# Patient Record
Sex: Female | Born: 1970 | Race: White | Hispanic: No | Marital: Married | Smoking: Never smoker
Health system: Southern US, Community
[De-identification: ages and names within clinical notes are randomized; demographics above are authoritative.]

## PROBLEM LIST (undated history)

## (undated) HISTORY — PX: BREAST BIOPSY: SHX20

---

## 2004-02-13 ENCOUNTER — Ambulatory Visit: Payer: Self-pay | Admitting: Family Medicine

## 2004-11-17 ENCOUNTER — Ambulatory Visit: Payer: Self-pay | Admitting: Unknown Physician Specialty

## 2005-08-11 ENCOUNTER — Other Ambulatory Visit: Payer: Self-pay

## 2005-08-11 ENCOUNTER — Emergency Department: Payer: Self-pay | Admitting: Emergency Medicine

## 2006-10-10 ENCOUNTER — Observation Stay: Payer: Self-pay | Admitting: Obstetrics and Gynecology

## 2006-10-11 ENCOUNTER — Inpatient Hospital Stay: Payer: Self-pay | Admitting: Obstetrics and Gynecology

## 2009-02-26 ENCOUNTER — Observation Stay: Payer: Self-pay | Admitting: Obstetrics and Gynecology

## 2009-04-14 ENCOUNTER — Ambulatory Visit: Payer: Self-pay | Admitting: Obstetrics and Gynecology

## 2009-04-15 ENCOUNTER — Inpatient Hospital Stay: Payer: Self-pay | Admitting: Obstetrics and Gynecology

## 2010-11-09 ENCOUNTER — Ambulatory Visit: Payer: Self-pay | Admitting: Obstetrics and Gynecology

## 2011-04-14 ENCOUNTER — Ambulatory Visit: Payer: Self-pay

## 2011-04-21 ENCOUNTER — Ambulatory Visit: Payer: Self-pay

## 2011-05-17 ENCOUNTER — Ambulatory Visit: Payer: Self-pay | Admitting: Unknown Physician Specialty

## 2011-07-06 ENCOUNTER — Ambulatory Visit: Payer: Self-pay | Admitting: Internal Medicine

## 2011-07-06 LAB — CBC CANCER CENTER
Basophil %: 1 %
Eosinophil #: 0.1 x10 3/mm (ref 0.0–0.7)
HCT: 35.8 % (ref 35.0–47.0)
Lymphocyte #: 1.4 x10 3/mm (ref 1.0–3.6)
Lymphocyte %: 20.9 %
MCH: 28.2 pg (ref 26.0–34.0)
MCHC: 33.1 g/dL (ref 32.0–36.0)
Monocyte %: 10.1 %
Neutrophil #: 4.3 x10 3/mm (ref 1.4–6.5)
Neutrophil %: 65.8 %
RDW: 14.1 % (ref 11.5–14.5)
WBC: 6.6 x10 3/mm (ref 3.6–11.0)

## 2011-07-06 LAB — IRON AND TIBC
Iron Bind.Cap.(Total): 428 ug/dL (ref 250–450)
Iron Saturation: 17 %
Unbound Iron-Bind.Cap.: 357 ug/dL

## 2011-07-06 LAB — CREATININE, SERUM
Creatinine: 0.84 mg/dL (ref 0.60–1.30)
EGFR (Non-African Amer.): >60

## 2011-07-06 LAB — FERRITIN: Ferritin (ARMC): 23 ng/mL (ref 8–388)

## 2011-07-07 LAB — PROT IMMUNOELECTROPHORES(ARMC)

## 2011-07-07 LAB — KAPPA/LAMBDA FREE LIGHT CHAINS (ARMC)

## 2011-07-07 LAB — URINE IEP, RANDOM

## 2011-07-28 ENCOUNTER — Ambulatory Visit: Payer: Self-pay | Admitting: Internal Medicine

## 2011-08-25 ENCOUNTER — Ambulatory Visit: Payer: Self-pay | Admitting: Unknown Physician Specialty

## 2011-09-21 ENCOUNTER — Ambulatory Visit: Payer: Self-pay | Admitting: Internal Medicine

## 2011-09-21 LAB — CANCER CENTER HEMOGLOBIN: HGB: 11.7 g/dL — ABNORMAL LOW (ref 12.0–16.0)

## 2011-09-22 LAB — PROT IMMUNOELECTROPHORES(ARMC)

## 2011-09-27 ENCOUNTER — Ambulatory Visit: Payer: Self-pay | Admitting: Internal Medicine

## 2011-11-30 ENCOUNTER — Ambulatory Visit: Payer: Self-pay | Admitting: Obstetrics and Gynecology

## 2011-12-01 ENCOUNTER — Ambulatory Visit: Payer: Self-pay | Admitting: Obstetrics and Gynecology

## 2013-03-29 HISTORY — PX: BREAST BIOPSY: SHX20

## 2013-07-06 ENCOUNTER — Ambulatory Visit: Payer: Self-pay

## 2013-07-23 ENCOUNTER — Ambulatory Visit: Payer: Self-pay

## 2014-10-16 ENCOUNTER — Other Ambulatory Visit: Payer: Self-pay | Admitting: Obstetrics and Gynecology

## 2014-10-16 DIAGNOSIS — Z1231 Encounter for screening mammogram for malignant neoplasm of breast: Secondary | ICD-10-CM

## 2014-11-01 ENCOUNTER — Ambulatory Visit
Admission: RE | Admit: 2014-11-01 | Discharge: 2014-11-01 | Disposition: A | Discharge status: Home / Self Care | Payer: BLUE CROSS/BLUE SHIELD | Source: Ambulatory Visit | Attending: Obstetrics and Gynecology | Admitting: Obstetrics and Gynecology

## 2014-11-01 ENCOUNTER — Other Ambulatory Visit: Payer: Self-pay | Admitting: Obstetrics and Gynecology

## 2014-11-01 DIAGNOSIS — Z1231 Encounter for screening mammogram for malignant neoplasm of breast: Secondary | ICD-10-CM

## 2015-01-10 IMAGING — MG MM ADDITIONAL VIEWS AT NO CHARGE
2 series · 8 of 8 positions shown · non-contrast
Comparison: Priors

CLINICAL DATA: Patient recalled from screening for possible right
breast asymmetry.

EXAM:
DIGITAL DIAGNOSTIC  RIGHT MAMMOGRAM WITH CAD
ULTRASOUND RIGHT BREAST

[R CC · right · 5 of 5 slices shown]
[im 1/5]
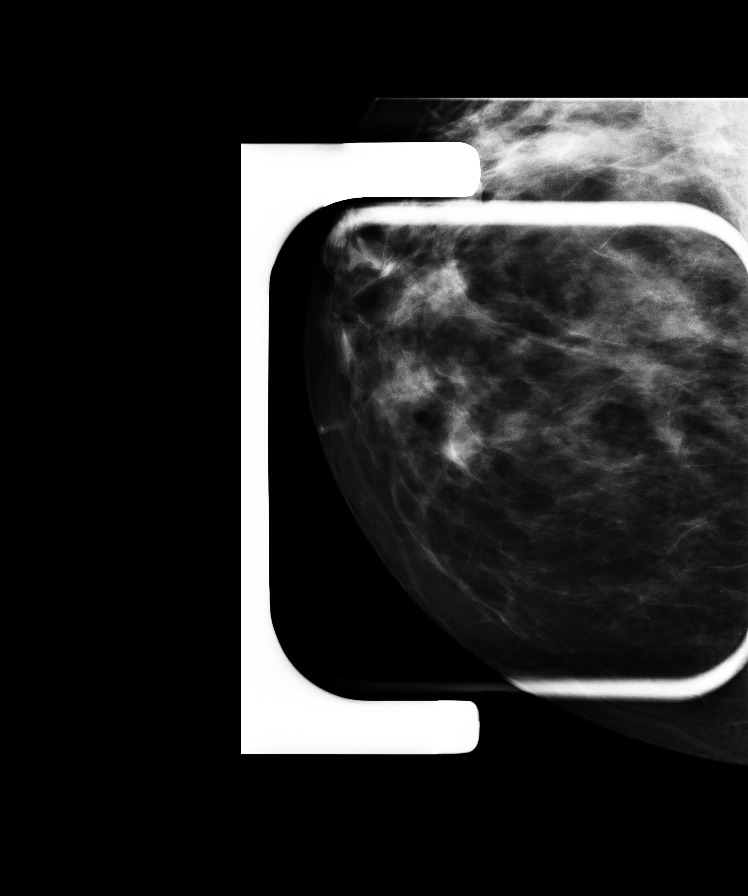
[im 2/5]
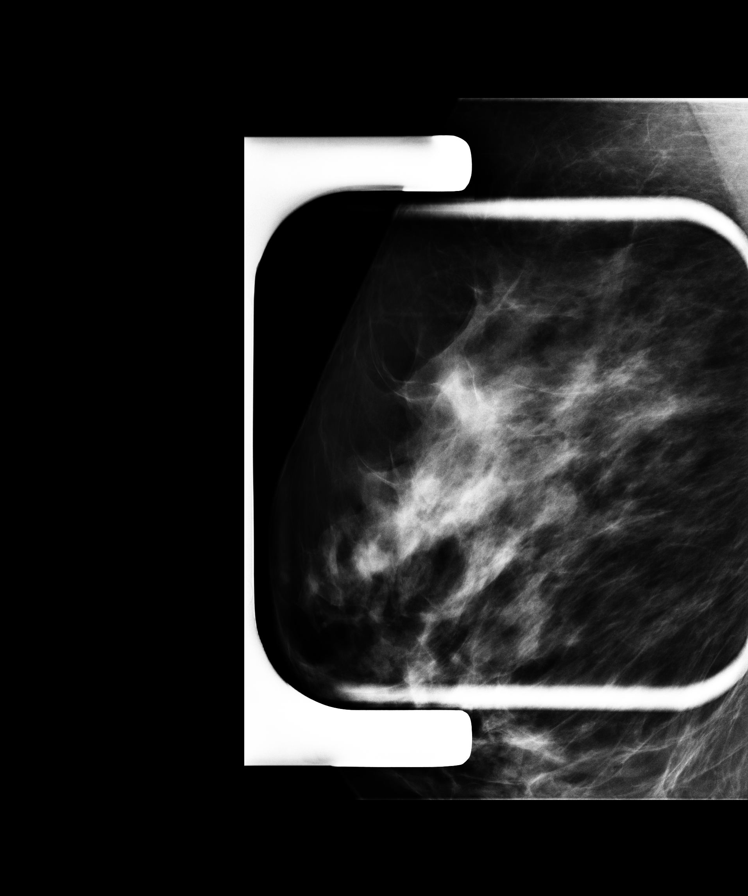
[im 3/5]
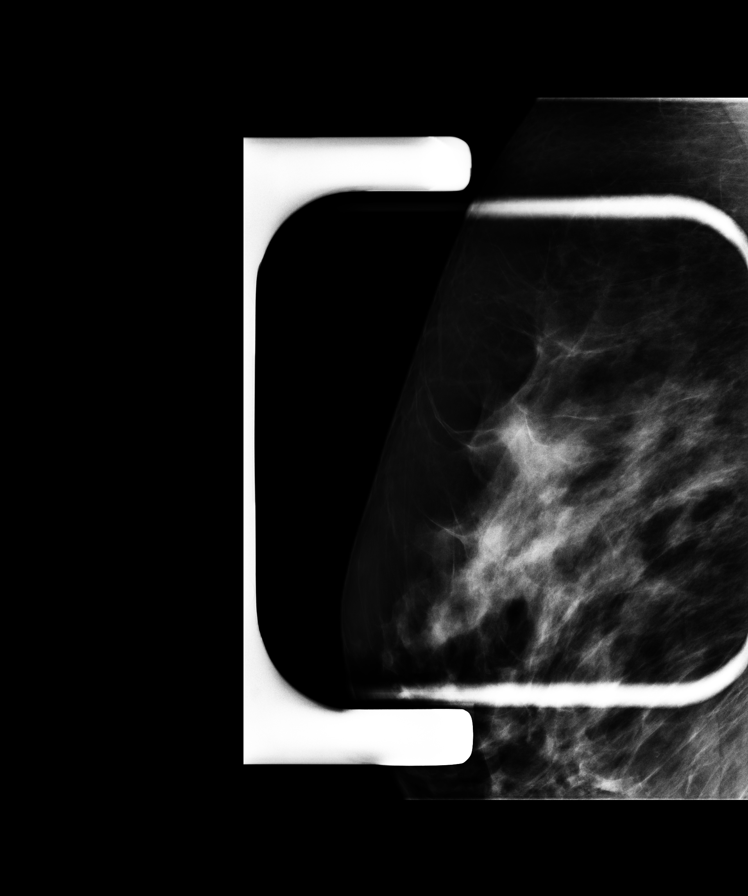
[im 4/5]
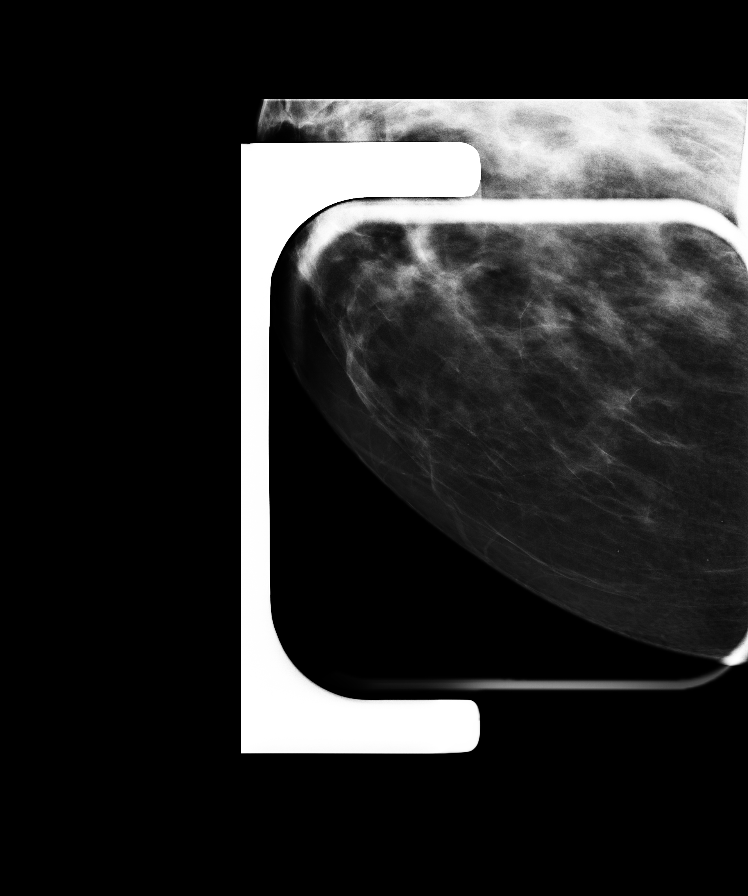
[im 5/5]
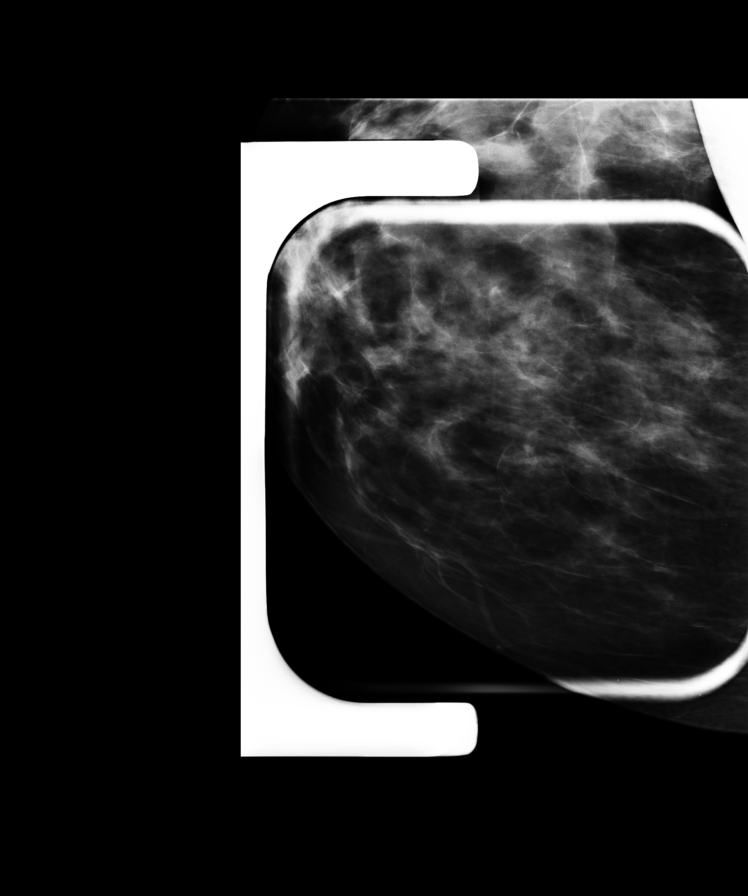

[R ML · right · 3 of 3 slices shown]
[im 1/3]
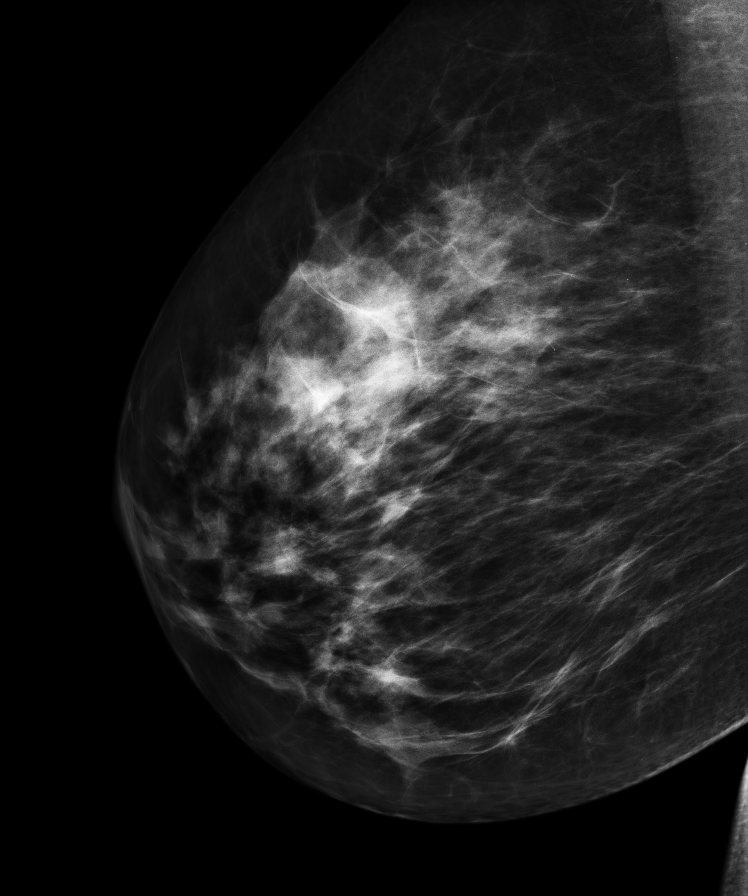
[im 2/3]
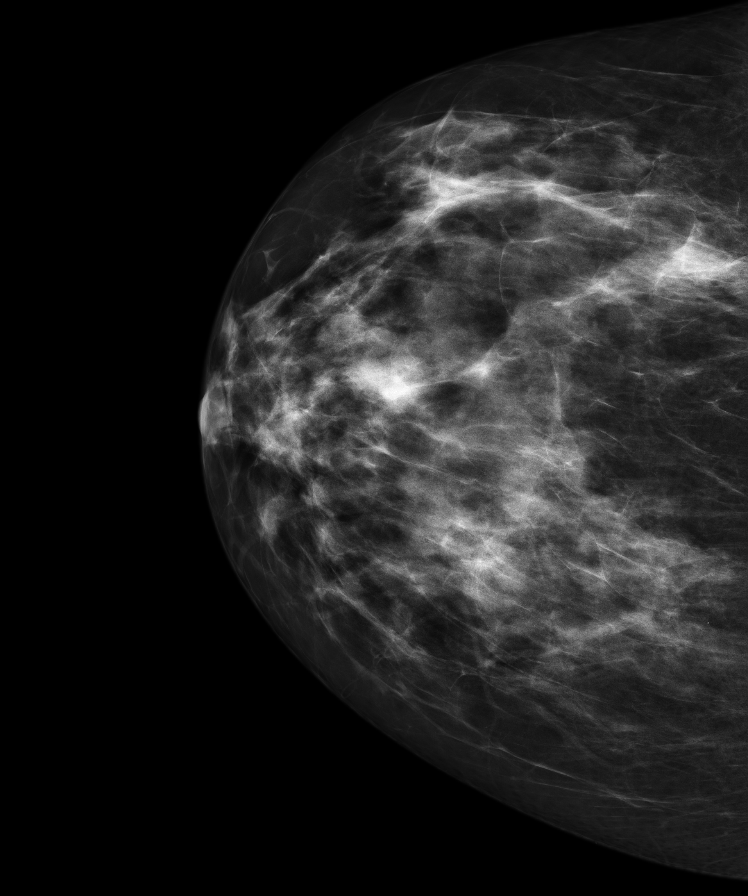
[im 3/3]
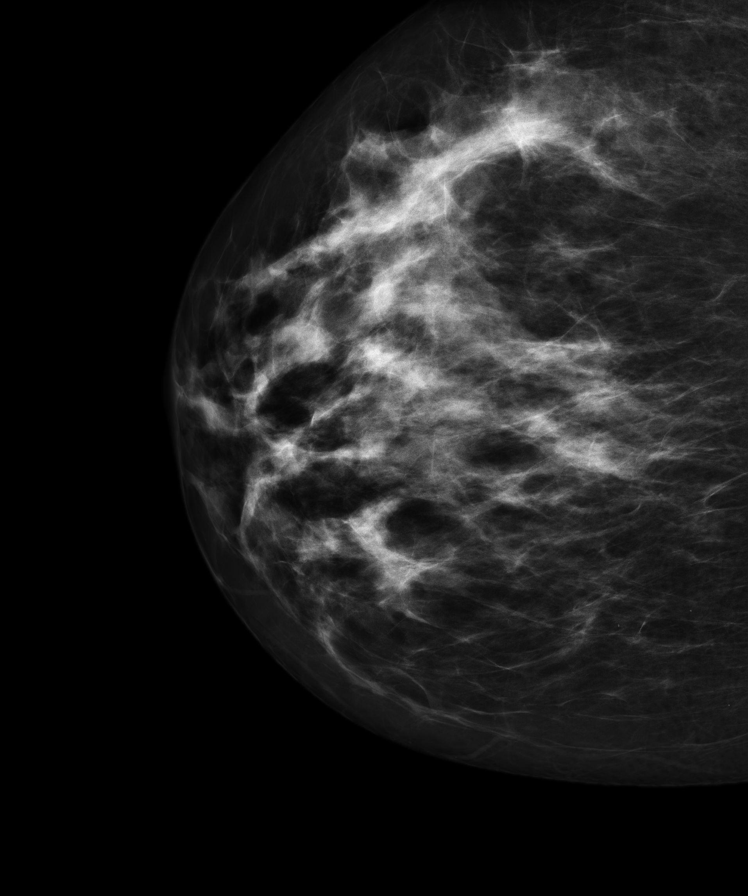

[8 of 8 positions shown; findings below may reference images not displayed]

ACR Breast Density Category c: The breast tissue is heterogeneously
dense, which may obscure small masses.
FINDINGS: Spot compression CC and MLO views as well as rolled CC and ML views
of the right breast were obtained. The questioned asymmetry within
the upper inner right breast appeared to resolve with additional
imaging, most compatible with overlapping fibroglandular breast
tissue.

Mammographic images were processed with CAD.

On physical exam, I palpate no discrete mass within the upper inner
right breast.

Ultrasound is performed, showing dense fibroglandular tissue without
concerning underlying mass.
IMPRESSION: No mammographic evidence for malignancy.

RECOMMENDATION:
Screening mammogram in one year.(Code:DW-F-B5O)

I have discussed the findings and recommendations with the patient.
Results were also provided in writing at the conclusion of the
visit. If applicable, a reminder letter will be sent to the patient
regarding the next appointment.

BI-RADS CATEGORY  1: Negative.

## 2015-07-22 ENCOUNTER — Ambulatory Visit: Payer: BLUE CROSS/BLUE SHIELD | Admitting: Internal Medicine

## 2016-04-20 IMAGING — MG MM DIGITAL SCREENING BILAT W/ TOMO W/ CAD
8 of 12 series · 8 of 28 positions shown · non-contrast
Comparison: Previous exam(s).

CLINICAL DATA: Screening.

EXAM:
DIGITAL SCREENING BILATERAL MAMMOGRAM WITH 3D TOMO WITH CAD

[R MLO synth-2D]
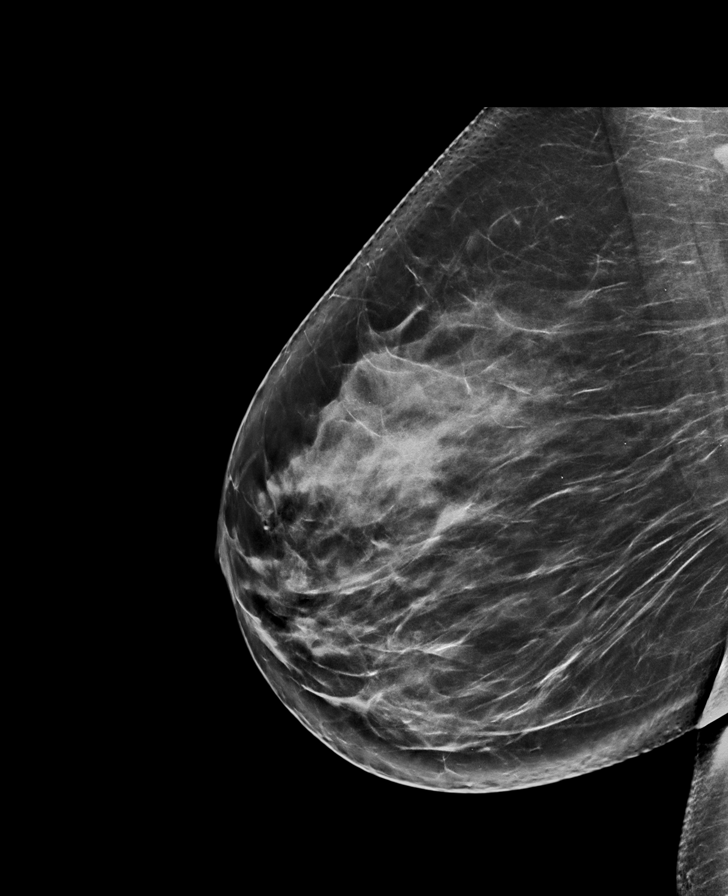

[L CC synth-2D]
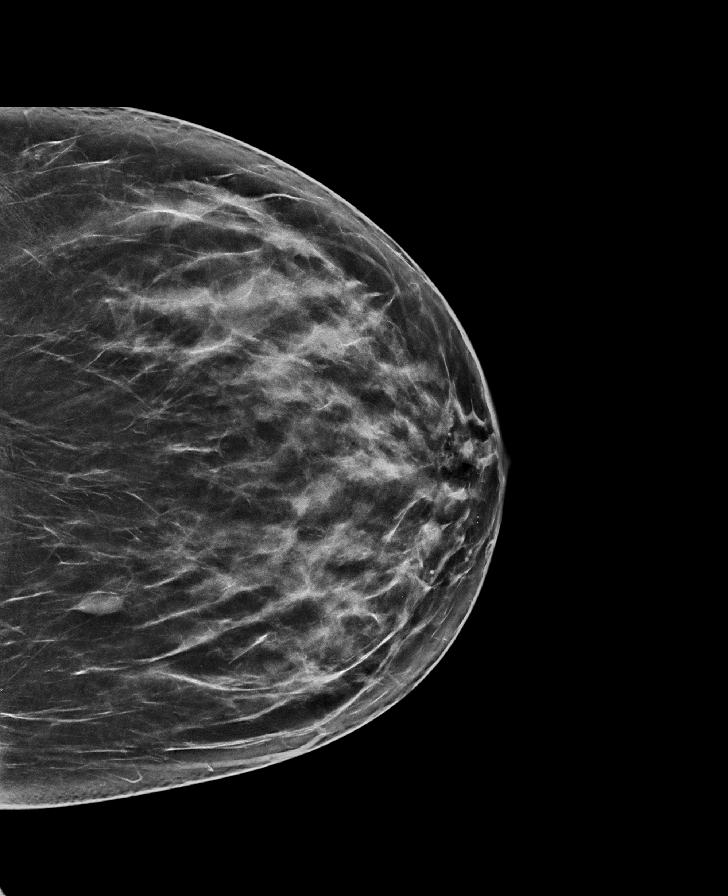

[L CC]
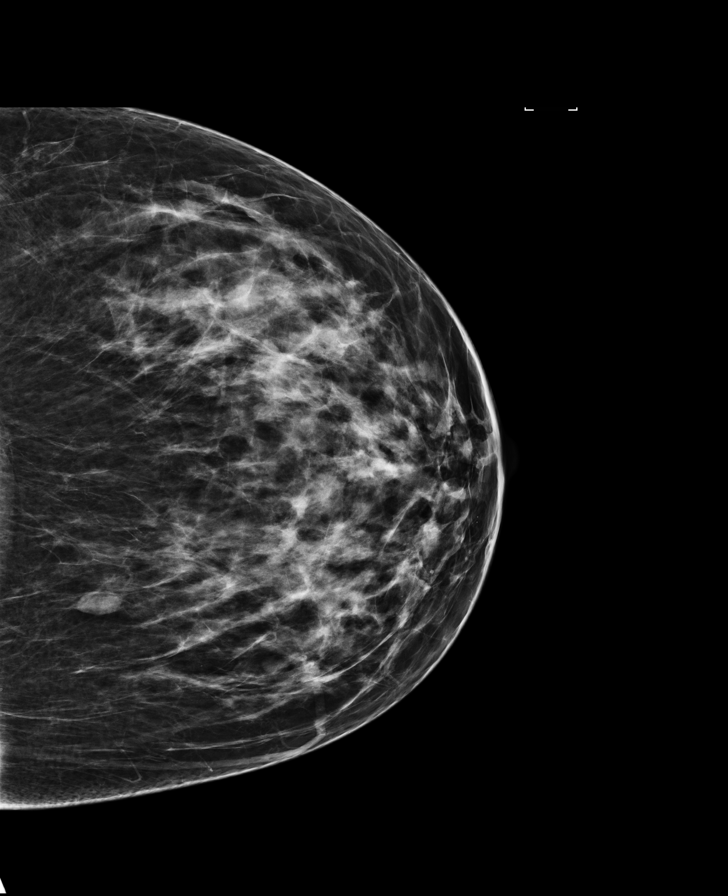

[R MLO]
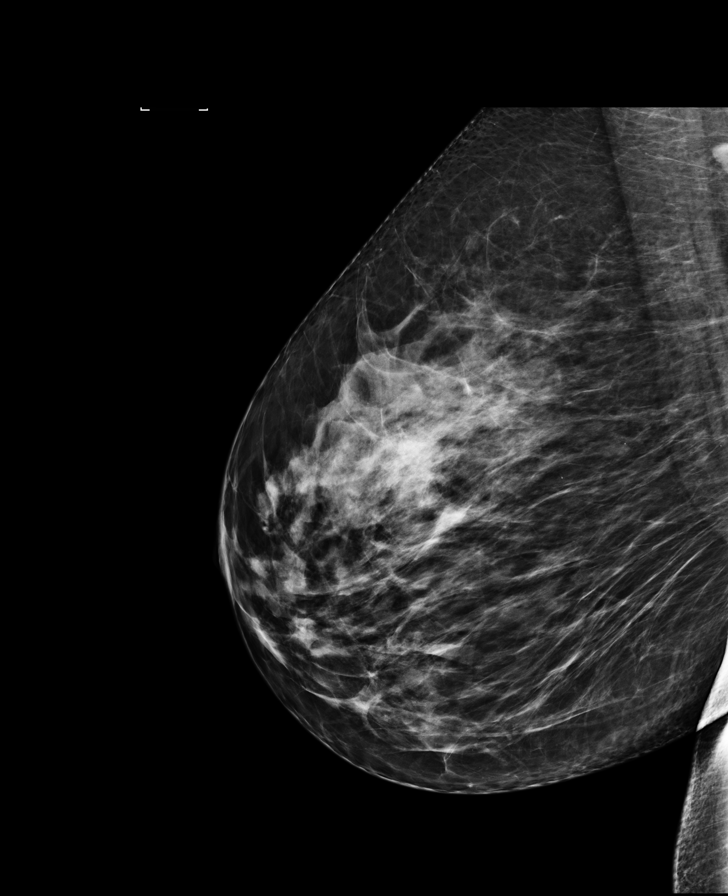

[R CC]
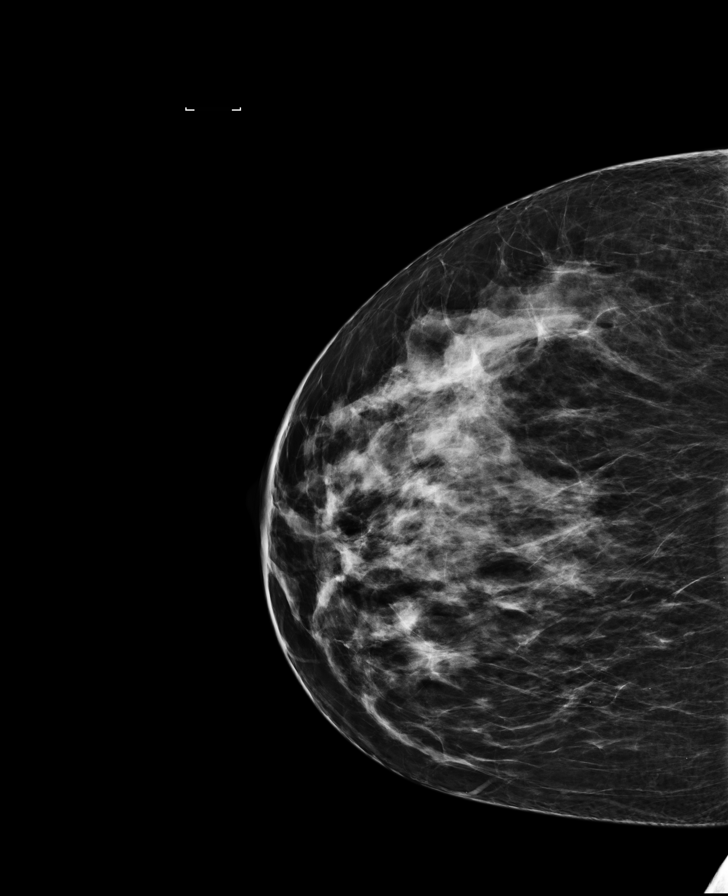

[L MLO synth-2D]
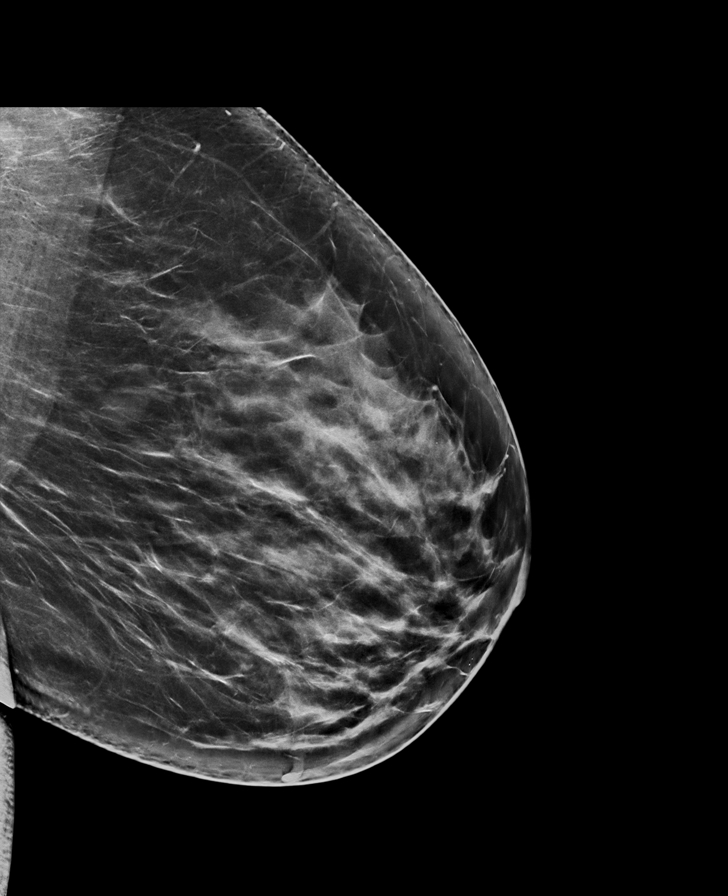

[R CC synth-2D]
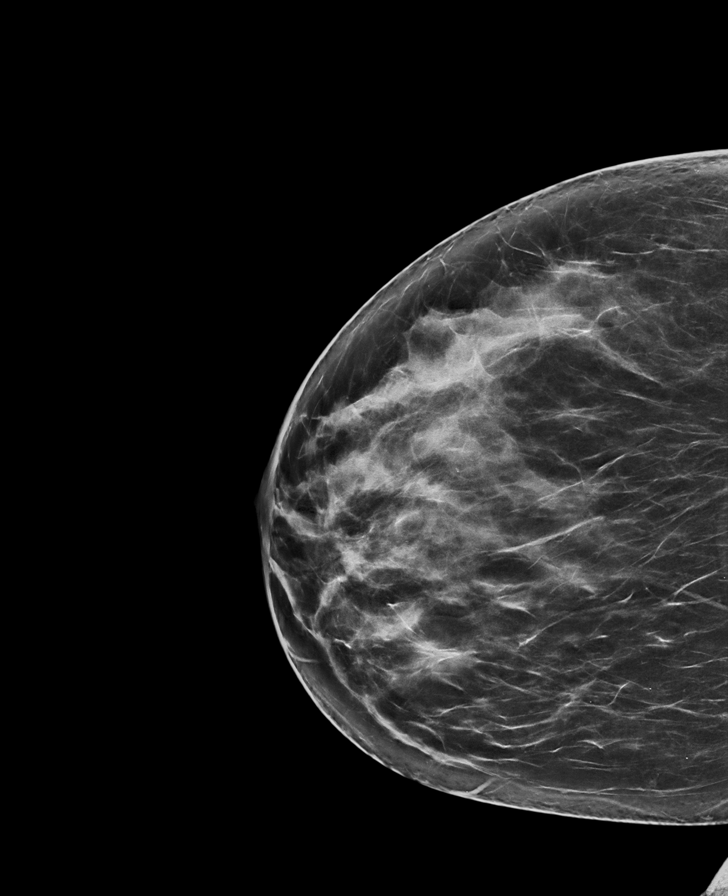

[L MLO]
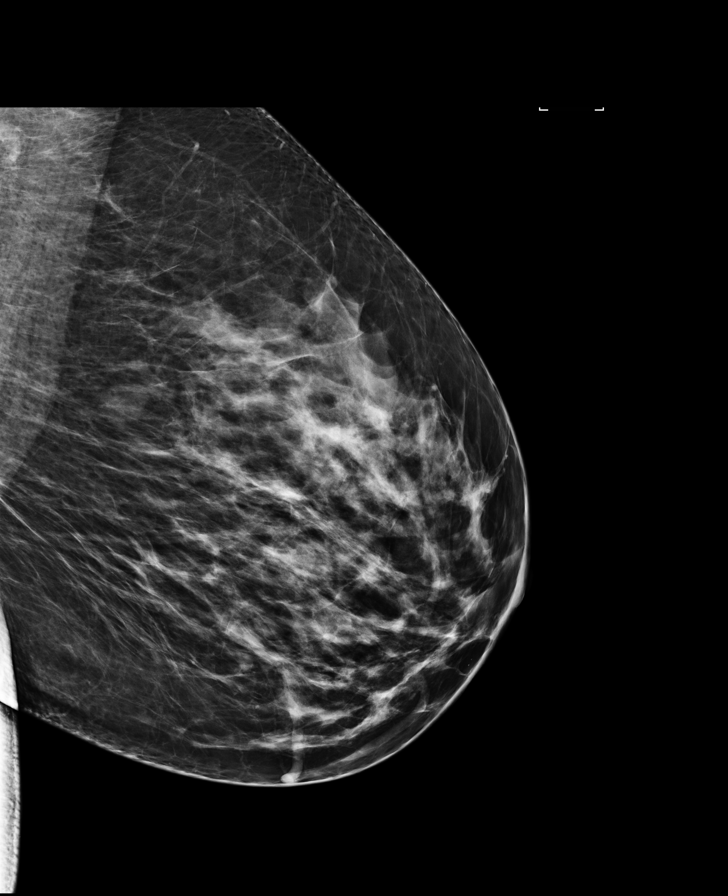

[8 of 28 positions shown; findings below may reference images not displayed]

ACR Breast Density Category c: The breast tissue is heterogeneously
dense, which may obscure small masses.
FINDINGS: There are no findings suspicious for malignancy. Images were
processed with CAD.
IMPRESSION: No mammographic evidence of malignancy. A result letter of this
screening mammogram will be mailed directly to the patient.

RECOMMENDATION:
Screening mammogram in one year. (Code:OA-G-1SS)

BI-RADS CATEGORY  1: Negative.

## 2019-04-26 ENCOUNTER — Other Ambulatory Visit: Payer: Self-pay | Admitting: Obstetrics and Gynecology

## 2019-04-26 DIAGNOSIS — Z1231 Encounter for screening mammogram for malignant neoplasm of breast: Secondary | ICD-10-CM

## 2019-06-05 ENCOUNTER — Ambulatory Visit
Admission: RE | Admit: 2019-06-05 | Discharge: 2019-06-05 | Disposition: A | Discharge status: Home / Self Care | Payer: PRIVATE HEALTH INSURANCE | Source: Ambulatory Visit | Attending: Obstetrics and Gynecology | Admitting: Obstetrics and Gynecology

## 2019-06-05 DIAGNOSIS — Z1231 Encounter for screening mammogram for malignant neoplasm of breast: Secondary | ICD-10-CM | POA: Diagnosis not present

## 2020-05-15 ENCOUNTER — Other Ambulatory Visit: Payer: Self-pay | Admitting: Orthopedic Surgery

## 2020-05-15 ENCOUNTER — Other Ambulatory Visit: Payer: Self-pay | Admitting: Obstetrics and Gynecology

## 2020-05-15 DIAGNOSIS — Z1231 Encounter for screening mammogram for malignant neoplasm of breast: Secondary | ICD-10-CM

## 2020-06-05 ENCOUNTER — Ambulatory Visit
Admission: RE | Admit: 2020-06-05 | Discharge: 2020-06-05 | Disposition: A | Discharge status: Home / Self Care | Payer: PRIVATE HEALTH INSURANCE | Source: Ambulatory Visit | Attending: Obstetrics and Gynecology | Admitting: Obstetrics and Gynecology

## 2020-06-05 ENCOUNTER — Other Ambulatory Visit: Payer: Self-pay

## 2020-06-05 DIAGNOSIS — Z1231 Encounter for screening mammogram for malignant neoplasm of breast: Secondary | ICD-10-CM | POA: Insufficient documentation

## 2021-01-15 ENCOUNTER — Other Ambulatory Visit: Payer: Self-pay

## 2021-01-15 ENCOUNTER — Ambulatory Visit (INDEPENDENT_AMBULATORY_CARE_PROVIDER_SITE_OTHER): Payer: No Typology Code available for payment source | Admitting: Dermatology

## 2021-01-15 ENCOUNTER — Encounter: Payer: Self-pay | Admitting: Dermatology

## 2021-01-15 DIAGNOSIS — L814 Other melanin hyperpigmentation: Secondary | ICD-10-CM

## 2021-01-15 DIAGNOSIS — L820 Inflamed seborrheic keratosis: Secondary | ICD-10-CM

## 2021-01-15 DIAGNOSIS — L811 Chloasma: Secondary | ICD-10-CM

## 2021-01-15 DIAGNOSIS — L918 Other hypertrophic disorders of the skin: Secondary | ICD-10-CM

## 2021-01-15 DIAGNOSIS — L578 Other skin changes due to chronic exposure to nonionizing radiation: Secondary | ICD-10-CM

## 2021-01-15 DIAGNOSIS — D224 Melanocytic nevi of scalp and neck: Secondary | ICD-10-CM

## 2021-01-15 DIAGNOSIS — D492 Neoplasm of unspecified behavior of bone, soft tissue, and skin: Secondary | ICD-10-CM

## 2021-01-15 DIAGNOSIS — Z1283 Encounter for screening for malignant neoplasm of skin: Secondary | ICD-10-CM

## 2021-01-15 DIAGNOSIS — D239 Other benign neoplasm of skin, unspecified: Secondary | ICD-10-CM

## 2021-01-15 DIAGNOSIS — L738 Other specified follicular disorders: Secondary | ICD-10-CM | POA: Diagnosis not present

## 2021-01-15 DIAGNOSIS — L821 Other seborrheic keratosis: Secondary | ICD-10-CM

## 2021-01-15 DIAGNOSIS — D1801 Hemangioma of skin and subcutaneous tissue: Secondary | ICD-10-CM

## 2021-01-15 DIAGNOSIS — D2361 Other benign neoplasm of skin of right upper limb, including shoulder: Secondary | ICD-10-CM

## 2021-01-15 DIAGNOSIS — L304 Erythema intertrigo: Secondary | ICD-10-CM

## 2021-01-15 NOTE — Progress Notes (Signed)
New Patient Visit  Subjective  Carol Richards is a 50 y.o. female who presents for the following: Total body skin exam (No hx of skin ca, or family hx of skin ca), Skin Tag (Bil axilla, around eyes, >2 yrs), and Nevus (Neck, ). The patient presents for Total-Body Skin Exam (TBSE) for skin cancer screening and mole check.  The following portions of the chart were reviewed this encounter and updated as appropriate:   Allergies  Meds  Problems  Med Hx  Surg Hx  Fam Hx     Review of Systems:  No other skin or systemic complaints except as noted in HPI or Assessment and Plan.  Objective  Well appearing patient in no apparent distress; mood and affect are within normal limits.  A full examination was performed including scalp, head, eyes, ears, nose, lips, neck, chest, axillae, abdomen, back, buttocks, bilateral upper extremities, bilateral lower extremities, hands, feet, fingers, toes, fingernails, and toenails. All findings within normal limits unless otherwise noted below.  forehead Yellow lobulated paps  forehead, chin Reticulated hyperpigmented patches.   R axilla, periocular Fleshy, skin-colored pedunculated papules.    Right posterior shoulder Firm pink/brown papulenodule with dimple sign.   R medial bicep Fleshy pap 0.8cm  L inframammary and R inframammary x 23, Total = 23 (23) Erythematous keratotic or waxy stuck-on papule or plaque.    Assessment & Plan   Lentigines - Scattered tan macules - Due to sun exposure - Benign-appearing, observe - Recommend daily broad spectrum sunscreen SPF 30+ to sun-exposed areas, reapply every 2 hours as needed. - Call for any changes  Seborrheic Keratoses - Stuck-on, waxy, tan-brown papules and/or plaques  - Benign-appearing - Discussed benign etiology and prognosis. - Observe - Call for any changes  Melanocytic Nevi - Tan-brown and/or pink-flesh-colored symmetric macules and papules - Benign appearing on exam  today - Observation - Call clinic for new or changing moles - Recommend daily use of broad spectrum spf 30+ sunscreen to sun-exposed areas.   Hemangiomas - Red papules - Discussed benign nature - Observe - Call for any changes  Actinic Damage - Chronic condition, secondary to cumulative UV/sun exposure - diffuse scaly erythematous macules with underlying dyspigmentation - Recommend daily broad spectrum sunscreen SPF 30+ to sun-exposed areas, reapply every 2 hours as needed.  - Staying in the shade or wearing long sleeves, sun glasses (UVA+UVB protection) and wide brim hats (4-inch brim around the entire circumference of the hat) are also recommended for sun protection.  - Call for new or changing lesions.  Skin cancer screening performed today.  Sebaceous hyperplasia forehead Benign-appearing.  Observation.  Call clinic for new or changing lesions.  Recommend daily use of broad spectrum spf 30+ sunscreen to sun-exposed areas.  Discussed cosmetic ED $60 for first lesion and $15 for each additional txted on same day.  Melasma forehead, chin Melasma is a chronic condition of persistent pigmented patches generally on the face, worse in summer due to higher UV exposure.  Oral estrogen containing BCPs or supplements can exacerbate condition.  Recommend daily broad spectrum tinted sunscreen SPF 30+ to face, preferably with Zinc or Titanium Dioxide. Discussed Rx topical bleaching creams (i.e. hydroquinone), OTC HelioCare supplement, chemical peels (would need multiple for best result).   Will discuss treatment further on f/u  Skin tag R axilla, periocular Benign, Discussed Snip excision Insurance usually does not cover this.  Discussed out-of-pocket fee of $115 if she is interested.  Dermatofibroma Right posterior shoulder Benign-appearing.  Observation.  Call clinic for new or changing moles.  Recommend daily use of broad spectrum spf 30+ sunscreen to sun-exposed areas.     Intertrigo bil Inframammary Intertrigo is a chronic recurrent rash that occurs in skin fold areas that may be associated with friction; heat; moisture; yeast; fungus; and bacteria.  It is exacerbated by increased movement / activity; sweating; and higher atmospheric temperature.  Start [facility] Iodoquinol 1%, Hydrocortisone 2.5%, Niacinamide 2% Cream twice a day to affected areas for up to two weeks.  The patient was advised this is not covered by insurance since it is made by a compounding pharmacy. They will receive an email to check out and the medication will be mailed to their home.    Neoplasm of skin R medial bicep Epidermal / dermal shaving  Lesion diameter (cm):  0.8 Informed consent: discussed and consent obtained   Timeout: patient name, date of birth, surgical site, and procedure verified   Procedure prep:  Patient was prepped and draped in usual sterile fashion Prep type:  Isopropyl alcohol Anesthesia: the lesion was anesthetized in a standard fashion   Anesthetic:  1% lidocaine w/ epinephrine 1-100,000 buffered w/ 8.4% NaHCO3 Instrument used: flexible razor blade   Hemostasis achieved with: pressure, aluminum chloride and electrodesiccation   Outcome: patient tolerated procedure well   Post-procedure details: sterile dressing applied and wound care instructions given   Dressing type: bandage and bacitracin    Specimen 1 - Surgical pathology Differential Diagnosis: D48.5 Irritated nevus vs Irritated Skin tag vs other Check Margins: No Fleshy pap 0.8cm  Inflamed seborrheic keratosis L inframammary and R inframammary x 23, Total = 23 Destruction of lesion - L inframammary and R inframammary x 23, Total = 23 Complexity: simple   Destruction method: cryotherapy   Informed consent: discussed and consent obtained   Timeout:  patient name, date of birth, surgical site, and procedure verified Lesion destroyed using liquid nitrogen: Yes   Region frozen until ice  ball extended beyond lesion: Yes   Outcome: patient tolerated procedure well with no complications   Post-procedure details: wound care instructions given    Skin cancer screening  Return in about 3 months (around 04/17/2021) for f/u ISK, Intertrigo, discuss melasma txt and possibly txt nevus face and sebaceous hyperplasia face.  I, Othelia Pulling, RMA, am acting as scribe for Sarina Ser, MD . Documentation: I have reviewed the above documentation for accuracy and completeness, and I agree with the above.  Sarina Ser, MD

## 2021-01-15 NOTE — Patient Instructions (Addendum)
If you have any questions or concerns for your doctor, please call our main line at [phone] and press option 4 to reach your doctor's medical assistant. If no one answers, please leave a voicemail as directed and we will return your call as soon as possible. Messages left after 4 pm will be answered the following business day.   You may also send us a message via MyChart. We typically respond to MyChart messages within 1-2 business days.  For prescription refills, please ask your pharmacy to contact our office. Our fax number is [phone].  If you have an urgent issue when the clinic is closed that cannot wait until the next business day, you can page your doctor at the number below.    Please note that while we do our best to be available for urgent issues outside of office hours, we are not available 24/7.   If you have an urgent issue and are unable to reach us, you may choose to seek medical care at your doctor's office, retail clinic, urgent care center, or emergency room.  If you have a medical emergency, please immediately call 911 or go to the emergency department.  Pager Numbers  - Dr. Kowalski: [phone]  - Dr. Moye: [phone]  - Dr. Stewart: [phone]  In the event of inclement weather, please call our main line at [phone] for an update on the status of any delays or closures.  Dermatology Medication Tips: Please keep the boxes that topical medications come in in order to help keep track of the instructions about where and how to use these. Pharmacies typically print the medication instructions only on the boxes and not directly on the medication tubes.   If your medication is too expensive, please contact our office at [phone] option 4 or send us a message through MyChart.   We are unable to tell what your co-pay for medications will be in advance as this is different depending on your insurance coverage. However, we may be able to find a substitute  medication at lower cost or fill out paperwork to get insurance to cover a needed medication.   If a prior authorization is required to get your medication covered by your insurance company, please allow us 1-2 business days to complete this process.  Drug prices often vary depending on where the prescription is filled and some pharmacies may offer cheaper prices.  The website www.goodrx.com contains coupons for medications through different pharmacies. The prices here do not account for what the cost may be with help from insurance (it may be cheaper with your insurance), but the website can give you the price if you did not use any insurance.  - You can print the associated coupon and take it with your prescription to the pharmacy.  - You may also stop by our office during regular business hours and pick up a GoodRx coupon card.  - If you need your prescription sent electronically to a different pharmacy, notify our office through [facility] MyChart or by phone at [phone] option 4.   Wound Care Instructions  Cleanse wound gently with soap and water once a day then pat dry with clean gauze. Apply a thing coat of Petrolatum (petroleum jelly, "Vaseline") over the wound (unless you have an allergy to this). We recommend that you use a new, sterile tube of Vaseline. Do not pick or remove scabs. Do not remove the yellow or white "healing tissue" from the base of the wound.  Cover the   wound with fresh, clean, nonstick gauze and secure with paper tape. You may use Band-Aids in place of gauze and tape if the would is small enough, but would recommend trimming much of the tape off as there is often too much. Sometimes Band-Aids can irritate the skin.  You should call the office for your biopsy report after 1 week if you have not already been contacted.  If you experience any problems, such as abnormal amounts of bleeding, swelling, significant bruising, significant pain, or evidence of infection,  please call the office immediately.  FOR ADULT SURGERY PATIENTS: If you need something for pain relief you may take 1 extra strength Tylenol (acetaminophen) AND 2 Ibuprofen (200mg  each) together every 4 hours as needed for pain. (do not take these if you are allergic to them or if you have a reason you should not take them.) Typically, you may only need pain medication for 1 to 3 days.   Instructions for [facility] Medications  One or more of your medications was sent to the [facility] mail order compounding pharmacy. You will receive an email from them and can purchase the medicine through that link. It will then be mailed to your home at the address you confirmed. If for any reason you do not receive an email from them, please check your spam folder. If you still do not find the email, please let us know. [facility] phone number is [phone].

## 2021-01-19 ENCOUNTER — Telehealth: Payer: Self-pay

## 2021-01-19 NOTE — Telephone Encounter (Signed)
-----   Message from Ralene Bathe, MD sent at 01/16/2021  6:25 PM EDT ----- Diagnosis Skin , right medial bicep [place]  Benign skin tag No further treatment needed

## 2021-01-19 NOTE — Telephone Encounter (Signed)
Patient informed of pathology results 

## 2021-02-04 ENCOUNTER — Ambulatory Visit
Admission: RE | Admit: 2021-02-04 | Discharge: 2021-02-04 | Disposition: A | Discharge status: Home / Self Care | Payer: No Typology Code available for payment source | Source: Ambulatory Visit | Attending: Obstetrics and Gynecology | Admitting: Obstetrics and Gynecology

## 2021-02-04 ENCOUNTER — Other Ambulatory Visit: Payer: Self-pay

## 2021-02-04 DIAGNOSIS — N939 Abnormal uterine and vaginal bleeding, unspecified: Secondary | ICD-10-CM | POA: Diagnosis not present

## 2021-02-04 MED ORDER — SODIUM CHLORIDE 0.9 % IV SOLN
300.0000 mg | Freq: Once | INTRAVENOUS | Status: AC
  Administered 2021-02-04: 300 mg via INTRAVENOUS
  Filled 2021-02-04: qty 300

## 2021-02-04 NOTE — Discharge Instructions (Signed)
See information given on Iron sucrose infusion. Keep all appointments as scheduled.

## 2021-02-11 ENCOUNTER — Ambulatory Visit
Admission: RE | Admit: 2021-02-11 | Discharge: 2021-02-11 | Disposition: A | Discharge status: Home / Self Care | Payer: No Typology Code available for payment source | Source: Ambulatory Visit | Attending: Obstetrics and Gynecology | Admitting: Obstetrics and Gynecology

## 2021-02-11 ENCOUNTER — Other Ambulatory Visit: Payer: Self-pay

## 2021-02-11 DIAGNOSIS — D500 Iron deficiency anemia secondary to blood loss (chronic): Secondary | ICD-10-CM | POA: Diagnosis not present

## 2021-02-11 MED ORDER — SODIUM CHLORIDE 0.9 % IV SOLN
300.0000 mg | Freq: Once | INTRAVENOUS | Status: AC
  Administered 2021-02-11: 300 mg via INTRAVENOUS
  Filled 2021-02-11: qty 300

## 2021-02-18 ENCOUNTER — Ambulatory Visit
Admission: RE | Admit: 2021-02-18 | Discharge: 2021-02-18 | Disposition: A | Discharge status: Home / Self Care | Payer: No Typology Code available for payment source | Source: Ambulatory Visit | Attending: Obstetrics and Gynecology | Admitting: Obstetrics and Gynecology

## 2021-02-18 DIAGNOSIS — D500 Iron deficiency anemia secondary to blood loss (chronic): Secondary | ICD-10-CM | POA: Insufficient documentation

## 2021-02-18 MED ORDER — SODIUM CHLORIDE 0.9 % IV SOLN
300.0000 mg | Freq: Once | INTRAVENOUS | Status: AC
  Administered 2021-02-18: 300 mg via INTRAVENOUS
  Filled 2021-02-18: qty 300

## 2021-02-25 ENCOUNTER — Other Ambulatory Visit: Payer: Self-pay

## 2021-02-25 ENCOUNTER — Ambulatory Visit
Admission: RE | Admit: 2021-02-25 | Discharge: 2021-02-25 | Disposition: A | Discharge status: Home / Self Care | Payer: No Typology Code available for payment source | Source: Ambulatory Visit | Attending: Obstetrics and Gynecology | Admitting: Obstetrics and Gynecology

## 2021-02-25 DIAGNOSIS — N939 Abnormal uterine and vaginal bleeding, unspecified: Secondary | ICD-10-CM | POA: Insufficient documentation

## 2021-02-25 DIAGNOSIS — D500 Iron deficiency anemia secondary to blood loss (chronic): Secondary | ICD-10-CM | POA: Insufficient documentation

## 2021-02-25 MED ORDER — SODIUM CHLORIDE 0.9 % IV SOLN
300.0000 mg | INTRAVENOUS | Status: DC
  Filled 2021-02-25: qty 15

## 2021-02-25 MED ORDER — SODIUM CHLORIDE 0.9 % IV SOLN
300.0000 mg | Freq: Once | INTRAVENOUS | Status: DC
  Administered 2021-02-25: 300 mg via INTRAVENOUS
  Filled 2021-02-25: qty 300

## 2021-02-25 MED ORDER — PENTAFLUOROPROP-TETRAFLUOROETH EX AERO
INHALATION_SPRAY | CUTANEOUS | Status: AC
  Filled 2021-02-25: qty 30

## 2021-03-04 ENCOUNTER — Ambulatory Visit
Admission: RE | Admit: 2021-03-04 | Discharge: 2021-03-04 | Disposition: A | Discharge status: Home / Self Care | Payer: No Typology Code available for payment source | Source: Ambulatory Visit | Attending: Obstetrics and Gynecology | Admitting: Obstetrics and Gynecology

## 2021-03-04 DIAGNOSIS — D509 Iron deficiency anemia, unspecified: Secondary | ICD-10-CM | POA: Diagnosis present

## 2021-03-04 MED ORDER — SODIUM CHLORIDE 0.9 % IV SOLN
300.0000 mg | Freq: Once | INTRAVENOUS | Status: AC
  Administered 2021-03-04: 300 mg via INTRAVENOUS
  Filled 2021-03-04: qty 300

## 2021-03-04 MED ORDER — SODIUM CHLORIDE FLUSH 0.9 % IV SOLN
INTRAVENOUS | Status: AC
  Filled 2021-03-04: qty 20

## 2021-03-04 NOTE — Progress Notes (Signed)
Patient IRON infusion completed around 11:00. No signs of acute distress. VS stable. Patient was made aware of next week schedule provided MD order's confirmation.

## 2021-03-10 ENCOUNTER — Other Ambulatory Visit: Payer: Self-pay

## 2021-03-10 ENCOUNTER — Ambulatory Visit
Admission: RE | Admit: 2021-03-10 | Discharge: 2021-03-10 | Disposition: A | Discharge status: Home / Self Care | Payer: No Typology Code available for payment source | Source: Ambulatory Visit | Attending: Obstetrics and Gynecology | Admitting: Obstetrics and Gynecology

## 2021-03-10 DIAGNOSIS — D509 Iron deficiency anemia, unspecified: Secondary | ICD-10-CM | POA: Insufficient documentation

## 2021-03-10 MED ORDER — SODIUM CHLORIDE FLUSH 0.9 % IV SOLN
INTRAVENOUS | Status: AC
  Filled 2021-03-10: qty 10

## 2021-03-10 MED ORDER — SODIUM CHLORIDE 0.9 % IV SOLN
300.0000 mg | Freq: Once | INTRAVENOUS | Status: AC
  Administered 2021-03-10: 300 mg via INTRAVENOUS
  Filled 2021-03-10: qty 300

## 2021-03-11 ENCOUNTER — Ambulatory Visit: Payer: No Typology Code available for payment source

## 2021-03-15 NOTE — Progress Notes (Signed)
Iron deficiency anemia

## 2021-03-17 NOTE — Progress Notes (Signed)
Iron deficient anemia

## 2021-04-20 ENCOUNTER — Other Ambulatory Visit: Payer: Self-pay

## 2021-04-20 ENCOUNTER — Ambulatory Visit (INDEPENDENT_AMBULATORY_CARE_PROVIDER_SITE_OTHER): Payer: PRIVATE HEALTH INSURANCE | Admitting: Dermatology

## 2021-04-20 ENCOUNTER — Encounter: Payer: Self-pay | Admitting: Dermatology

## 2021-04-20 DIAGNOSIS — L738 Other specified follicular disorders: Secondary | ICD-10-CM | POA: Diagnosis not present

## 2021-04-20 DIAGNOSIS — L304 Erythema intertrigo: Secondary | ICD-10-CM

## 2021-04-20 DIAGNOSIS — L811 Chloasma: Secondary | ICD-10-CM | POA: Diagnosis not present

## 2021-04-20 DIAGNOSIS — L700 Acne vulgaris: Secondary | ICD-10-CM | POA: Diagnosis not present

## 2021-04-20 DIAGNOSIS — L820 Inflamed seborrheic keratosis: Secondary | ICD-10-CM

## 2021-04-20 NOTE — Progress Notes (Signed)
Follow-Up Visit   Subjective  Carol Richards is a 51 y.o. female who presents for the following: Seborrheic Keratosis (Recheck ISK's at inframammary. Tx with LN2 at last visit. Thinks some areas may need additional treatment. ) and Rash (Recheck intertrigo at inframammary area. Did not get Iodoquinol 1%, Hydrocortisone 2.5%, Niacinamide 2% Cream. Had health issues after last visit. Was hospitalized. Rash is same. Will order cream.). The patient has spots, moles and lesions to be evaluated, some may be new or changing and the patient has concerns that these could be cancer.  The following portions of the chart were reviewed this encounter and updated as appropriate:  Tobacco   Allergies   Meds   Problems   Med Hx   Surg Hx   Fam Hx      Review of Systems: No other skin or systemic complaints except as noted in HPI or Assessment and Plan.  Objective  Well appearing patient in no apparent distress; mood and affect are within normal limits.  A focused examination was performed including face, neck, chest. Relevant physical exam findings are noted in the Assessment and Plan.  inframammary folds Erythema with scale  B/L Inframammary Areas x10 (10) Erythematous keratotic or waxy stuck-on papule or plaque.  Face Erythematous papules and pustules with comedones   forehead Yellowish papules  forehead, chin Reticulated hyperpigmented patches.    Assessment & Plan  Intertrigo inframammary folds Intertrigo is a chronic recurrent rash that occurs in skin fold areas that may be associated with friction; heat; moisture; yeast; fungus; and bacteria.  It is exacerbated by increased movement / activity; sweating; and higher atmospheric temperature.  Start [facility] Iodoquinol 1%, Hydrocortisone 2.5%, Niacinamide 2% Cream twice a day to affected areas for up to two weeks.  Inflamed seborrheic keratosis B/L Inframammary Areas x10 Destruction of lesion - B/L Inframammary Areas  x10 Complexity: simple   Destruction method: cryotherapy   Informed consent: discussed and consent obtained   Timeout:  patient name, date of birth, surgical site, and procedure verified Lesion destroyed using liquid nitrogen: Yes   Region frozen until ice ball extended beyond lesion: Yes   Outcome: patient tolerated procedure well with no complications   Post-procedure details: wound care instructions given    Acne vulgaris and rosacea Face Chronic and persistent Discussed starting Isotretinoin vs oral antibiotics.   Reviewed potential side effects of isotretinoin including xerosis, cheilitis, hepatitis, hyperlipidemia, and severe birth defects if taken by a pregnant woman. Reviewed reports of suicidal ideation in those with a history of depression while taking isotretinoin and reports of diagnosis of inflammatory bowl disease while taking isotretinoin as well as the lack of evidence for a causal relationship between isotretinoin, depression and IBD. Patient advised to reach out with any questions or concerns. Patient advised not to share pills or donate blood while on treatment or for one month after completing treatment.   FSH, LH, CBC w/diff, CMP, lipid, HFP, beta HCG.  Will register in [place] as soon as receive labs.  Email address for [employer]: [email or web address]  Patient reports she has had a tubal ligation and is post menopausal.   CBC with Differential/Platelet - Face Comprehensive metabolic panel - Face Lipid panel - Face hCG, serum, qualitative - Face Follicle Stimulating Hormone - Face Luteinizing Hormone - Face  Sebaceous hyperplasia forehead Benign-appearing.  Observation.  Call clinic for new or changing lesions.  Recommend daily use of broad spectrum spf 30+ sunscreen to sun-exposed areas.  Discussed cosmetic ED [phone]  for first lesion and $15 for each additional txted on same day.  Isotretinoin should help shrink these lesions.   Melasma forehead, chin Melasma is  a chronic condition of persistent pigmented patches generally on the face, worse in summer due to higher UV exposure.  Oral estrogen containing BCPs or supplements can exacerbate condition.  Recommend daily broad spectrum tinted sunscreen SPF 30+ to face, preferably with Zinc or Titanium Dioxide. Discussed Rx topical bleaching creams (i.e. hydroquinone), OTC HelioCare supplement, chemical peels (would need multiple for best result).   Will discuss treatment once acne is under control.   Return Pending labs.  I, Emelia Salisbury, CMA, am acting as scribe for Sarina Ser, MD. Documentation: I have reviewed the above documentation for accuracy and completeness, and I agree with the above.  Sarina Ser, MD

## 2021-04-20 NOTE — Patient Instructions (Addendum)
Cryotherapy Aftercare  Wash gently with soap and water everyday.   Apply Vaseline and Band-Aid daily until healed.   Prior to procedure, discussed risks of blister formation, small wound, skin dyspigmentation, or rare scar following cryotherapy. Recommend Vaseline ointment to treated areas while healing.  Instructions for [facility] Medications  One or more of your medications was sent to the [facility] mail order compounding pharmacy. You will receive an email from them and can purchase the medicine through that link. It will then be mailed to your home at the address you confirmed. If for any reason you do not receive an email from them, please check your spam folder. If you still do not find the email, please let us know. [facility] phone number is [phone].    If You Need Anything After Your Visit  If you have any questions or concerns for your doctor, please call our main line at [phone] and press option 4 to reach your doctor's medical assistant. If no one answers, please leave a voicemail as directed and we will return your call as soon as possible. Messages left after 4 pm will be answered the following business day.   You may also send [place] a message via [place]. We typically respond to MyChart messages within 1-2 business days.  For prescription refills, please ask your pharmacy to contact our office. Our fax number is [phone].  If you have an urgent issue when the clinic is closed that cannot wait until the next business day, you can page your doctor at the number below.    Please note that while we do our best to be available for urgent issues outside of office hours, we are not available 24/7.   If you have an urgent issue and are unable to reach [place], you may choose to seek medical care at your doctor's office, retail clinic, urgent care center, or emergency room.  If you have a medical emergency, please immediately call 911 or go to the emergency  department.  Pager Numbers  - Dr. Nehemiah Massed: [phone]  - Dr. Laurence Ferrari: [phone]  - Dr. Nicole Kindred: [phone]  In the event of inclement weather, please call our main line at [phone] for an update on the status of any delays or closures.  Dermatology Medication Tips: Please keep the boxes that topical medications come in in order to help keep track of the instructions about where and how to use these. Pharmacies typically print the medication instructions only on the boxes and not directly on the medication tubes.   If your medication is too expensive, please contact our office at [phone] option 4 or send [place] a message through [place].   We are unable to tell what your co-pay for medications will be in advance as this is different depending on your insurance coverage. However, we may be able to find a substitute medication at lower cost or fill out paperwork to get insurance to cover a needed medication.   If a prior authorization is required to get your medication covered by your insurance company, please allow [place] 1-2 business days to complete this process.  Drug prices often vary depending on where the prescription is filled and some pharmacies may offer cheaper prices.  The website www.goodrx.com contains coupons for medications through different pharmacies. The prices here do not account for what the cost may be with help from insurance (it may be cheaper with your insurance), but the website can give you the price if you did not  use any insurance.  - You can print the associated coupon and take it with your prescription to the pharmacy.  - You may also stop by our office during regular business hours and pick up a GoodRx coupon card.  - If you need your prescription sent electronically to a different pharmacy, notify our office through [facility] or by phone at [phone] option 4.     Si Usted Necesita Algo Despus de Su Visita  Tambin puede enviarnos un  mensaje a travs de Pharmacist, community. Por lo general respondemos a los mensajes de MyChart en el transcurso de 1 a 2 das hbiles.  Para renovar recetas, por favor pida a su farmacia que se ponga en contacto con nuestra oficina. Harland Dingwall de fax es [place] [phone].  Si tiene un asunto urgente cuando la clnica est cerrada y que no puede esperar hasta el siguiente da hbil, puede llamar/localizar a su doctor(a) al nmero que aparece a continuacin.   Por favor, tenga en cuenta que aunque hacemos todo lo posible para estar disponibles para asuntos urgentes fuera del horario de [place], no estamos disponibles las 24 horas del da, los 7 das de la [place].   Si tiene un problema urgente y no puede comunicarse con nosotros, puede optar por buscar atencin mdica  en el consultorio de su doctor(a), en una clnica privada, en un centro de atencin urgente o en una sala de emergencias.  Si tiene Engineering geologist, por favor llame inmediatamente al 911 o vaya a la sala de emergencias.  Nmeros de bper  - Dr. Nehemiah Massed: [phone]  - Dra. Moye: [phone]  - Dra. Nicole Kindred: [phone]  En caso de inclemencias del [place], por favor llame a Johnsie Kindred principal al [phone] para una actualizacin sobre el [place] de cualquier retraso o cierre.  Consejos para la medicacin en dermatologa: Por favor, guarde las cajas en las que vienen los medicamentos de uso tpico para ayudarle a seguir las instrucciones sobre dnde y cmo usarlos. Las farmacias generalmente imprimen las instrucciones del medicamento slo en las cajas y no directamente en los tubos del [place].   Si su medicamento es muy caro, por favor, pngase en contacto con Zigmund Daniel llamando al [phone] y presione la opcin 4 o envenos un mensaje a travs de Pharmacist, community.   No podemos decirle cul ser su copago por los medicamentos por adelantado ya que esto es diferente dependiendo de la cobertura de su seguro. Sin embargo,  es posible que podamos encontrar un medicamento sustituto a Electrical engineer un formulario para que el seguro cubra el medicamento que se considera necesario.   Si se requiere una autorizacin previa para que su compaa de seguros [place] su medicamento, por favor permtanos de 1 a 2 das hbiles para completar este proceso.  Los precios de los medicamentos varan con frecuencia dependiendo del Environmental consultant de dnde se surte la receta y alguna farmacias pueden ofrecer precios ms baratos.  El sitio web www.goodrx.com tiene cupones para medicamentos de Airline pilot. Los precios aqu no tienen en cuenta lo que podra costar con la ayuda del seguro (puede ser ms barato con su seguro), pero el sitio web puede darle el precio si no utiliz Research scientist (physical sciences).  - Puede imprimir el cupn correspondiente y llevarlo con su receta a la farmacia.  - Tambin puede pasar por nuestra oficina durante el horario de atencin regular y Charity fundraiser una tarjeta de cupones de GoodRx.  - Si necesita que su receta se enve electrnicamente  a [employer], informe a nuestra oficina a travs de MyChart de [place] o por telfono llamando al [phone] y presione la opcin 4.

## 2021-04-21 ENCOUNTER — Telehealth: Payer: Self-pay

## 2021-04-21 LAB — CBC WITH DIFFERENTIAL/PLATELET
Basophils Absolute: 0.1 10*3/uL (ref 0.0–0.2)
Basos: 1 %
EOS (ABSOLUTE): 0.2 10*3/uL (ref 0.0–0.4)
Eos: 2 %
Hematocrit: 44 % (ref 34.0–46.6)
Hemoglobin: 14.5 g/dL (ref 11.1–15.9)
Immature Grans (Abs): 0 10*3/uL (ref 0.0–0.1)
Immature Granulocytes: 1 %
Lymphocytes Absolute: 1.7 10*3/uL (ref 0.7–3.1)
Lymphs: 25 %
MCH: 27.7 pg (ref 26.6–33.0)
MCHC: 33 g/dL (ref 31.5–35.7)
MCV: 84 fL (ref 79–97)
Monocytes Absolute: 0.6 10*3/uL (ref 0.1–0.9)
Monocytes: 8 %
Neutrophils Absolute: 4.5 10*3/uL (ref 1.4–7.0)
Neutrophils: 63 %
Platelets: 365 10*3/uL (ref 150–450)
RBC: 5.24 x10E6/uL (ref 3.77–5.28)
RDW: 15.9 % — ABNORMAL HIGH (ref 11.7–15.4)
WBC: 7.1 10*3/uL (ref 3.4–10.8)

## 2021-04-21 LAB — COMPREHENSIVE METABOLIC PANEL
ALT: 30 IU/L (ref 0–32)
AST: 19 IU/L (ref 0–40)
Albumin/Globulin Ratio: 1.7 (ref 1.2–2.2)
Albumin: 4.3 g/dL (ref 3.8–4.9)
Alkaline Phosphatase: 102 IU/L (ref 44–121)
BUN/Creatinine Ratio: 14 (ref 9–23)
BUN: 12 mg/dL (ref 6–24)
Bilirubin Total: <0.2 mg/dL (ref 0.0–1.2)
CO2: 23 mmol/L (ref 20–29)
Calcium: 9.8 mg/dL (ref 8.7–10.2)
Chloride: 101 mmol/L (ref 96–106)
Creatinine, Ser: 0.84 mg/dL (ref 0.57–1.00)
Globulin, Total: 2.5 g/dL (ref 1.5–4.5)
Glucose: 93 mg/dL (ref 70–99)
Potassium: 4.8 mmol/L (ref 3.5–5.2)
Sodium: 140 mmol/L (ref 134–144)
Total Protein: 6.8 g/dL (ref 6.0–8.5)
eGFR: 84 mL/min/{1.73_m2} (ref >59)

## 2021-04-21 LAB — LIPID PANEL
Chol/HDL Ratio: 3.5 ratio (ref 0.0–4.4)
Cholesterol, Total: 175 mg/dL (ref 100–199)
HDL: 50 mg/dL (ref >39)
LDL Chol Calc (NIH): 104 mg/dL — ABNORMAL HIGH (ref 0–99)
Triglycerides: 118 mg/dL (ref 0–149)
VLDL Cholesterol Cal: 21 mg/dL (ref 5–40)

## 2021-04-21 LAB — FOLLICLE STIMULATING HORMONE: FSH: 99.2 m[IU]/mL

## 2021-04-21 LAB — LUTEINIZING HORMONE: LH: 41.2 m[IU]/mL

## 2021-04-21 LAB — HCG, SERUM, QUALITATIVE: hCG,Beta Subunit,Qual,Serum: NEGATIVE m[IU]/mL (ref <6)

## 2021-04-21 MED ORDER — ISOTRETINOIN 20 MG PO CAPS: 20.0000 mg | ORAL_CAPSULE | Freq: Every day | ORAL | 0 refills | Status: AC

## 2021-04-21 NOTE — Telephone Encounter (Signed)
Advised pt of labs.  Advised pt we would register her in [facility] as "Non-child bearing potential female" according to labs.  Advised we would send in Isotretinoin 20mg  1 po qd with fatty meals #30, [facility]rf [facility] [address] [address].  Advised [facility] we could send to [facility] for $75.

## 2021-04-21 NOTE — Telephone Encounter (Signed)
-----   Message from Ralene Bathe, MD sent at 04/21/2021  9:24 AM EST ----- Lab from 04/20/2021 showed: Chemistries including liver and kidney tests OK/normal Lipids OK. CBC OK Pregnancy negative. [place] & LH both consistent with Postmenopausal.  If patient wants to pursue Isotretinoin, she can be registered as "Non-child bearing potential female"  May start Isotretinoin 20 mg qd Make 1 month appt for fu

## 2021-04-25 ENCOUNTER — Encounter: Payer: Self-pay | Admitting: Dermatology

## 2021-05-27 ENCOUNTER — Other Ambulatory Visit: Payer: Self-pay

## 2021-05-27 ENCOUNTER — Ambulatory Visit (INDEPENDENT_AMBULATORY_CARE_PROVIDER_SITE_OTHER): Payer: PRIVATE HEALTH INSURANCE | Admitting: Dermatology

## 2021-05-27 VITALS — Wt 183.0 lb

## 2021-05-27 DIAGNOSIS — K130 Diseases of lips: Secondary | ICD-10-CM

## 2021-05-27 DIAGNOSIS — L738 Other specified follicular disorders: Secondary | ICD-10-CM

## 2021-05-27 DIAGNOSIS — L719 Rosacea, unspecified: Secondary | ICD-10-CM | POA: Diagnosis not present

## 2021-05-27 DIAGNOSIS — L853 Xerosis cutis: Secondary | ICD-10-CM

## 2021-05-27 DIAGNOSIS — L700 Acne vulgaris: Secondary | ICD-10-CM

## 2021-05-27 DIAGNOSIS — Z79899 Other long term (current) drug therapy: Secondary | ICD-10-CM

## 2021-05-27 MED ORDER — ISOTRETINOIN 30 MG PO CAPS: 30.0000 mg | ORAL_CAPSULE | Freq: Every day | ORAL | 0 refills | Status: DC

## 2021-05-27 NOTE — Progress Notes (Signed)
New RX with ipledge ID ?

## 2021-05-27 NOTE — Patient Instructions (Signed)

## 2021-05-27 NOTE — Progress Notes (Signed)
? ?  Isotretinoin Follow-Up Visit ?  ?Subjective  ?Carol Richards is a 51 y.o. female who presents for the following: Follow-up (Isotretinoin f/u ). Pt taking Isotretinoin 20 mg daily. ?+dry skin  ? ?Week # 4 ? ? Isotretinoin F/U - 05/27/21 1000   ? ?  ? Isotretinoin Follow Up  ? iPledge # [id number]   ? Date 05/27/21   ? Weight 183 lb (83 kg)   ? Acne breakouts since last visit? No   ?  ? Dosage  ? Target Dosage (mg) 12450   ? Current (To Date) Dosage (mg) 600   ? To Go Dosage (mg) 11850   ?  ? Side Effects  ? Skin Dry Skin   ? Gastrointestinal WNL   ? Neurological WNL   ? Constitutional WNL   ? ?  ?  ? ?  ?  ? ?  ?  ?Side effects: Dry skin, dry lips ? ?Denies changes in night vision, shortness of breath, abdominal pain, nausea, vomiting, diarrhea, blood in stool or urine, visual changes, headaches, epistaxis, joint pain, myalgias, mood changes, depression, or suicidal ideation.  ? ?The following portions of the chart were reviewed this encounter and updated as appropriate: medications, allergies, medical history ? ?Review of Systems:  No other skin or systemic complaints except as noted in HPI or Assessment and Plan. ? ?Objective  ?Well appearing patient in no apparent distress; mood and affect are within normal limits. ? ?An examination of the face, neck, chest, and back was performed and relevant findings are noted below.  ? ?Head - Anterior (Face) ?1 papule on the chin  ? ?Head - Anterior (Face) ?Yellowish papules  ? ? ?Assessment & Plan  ? ?Acne vulgaris ?Head - Anterior (Face) ? ?Acne vulgaris and rosacea ?Chronic and persistent ? ?  ?Reviewed potential side effects of isotretinoin including xerosis, cheilitis, hepatitis, hyperlipidemia, and severe birth defects if taken by a pregnant woman. Reviewed reports of suicidal ideation in those with a history of depression while taking isotretinoin and reports of diagnosis of inflammatory bowl disease while taking isotretinoin as well as the lack of evidence for a  causal relationship between isotretinoin, depression and IBD. Patient advised to reach out with any questions or concerns. Patient advised not to share pills or donate blood while on treatment or for one month after completing treatment.  ? ?I pledge # [id number] [facility] [address] ?Total 600 mg ?Total mg kg 7 ? ?Start Isotretinoin 30 mg take 1 tablet daily  ? ?Related Medications ?ISOtretinoin (ACCUTANE) 30 MG capsule ?Take 1 capsule (30 mg total) by mouth daily. ? 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 ?Isotretinoin should help shrink lesions ? ? ?Xerosis secondary to isotretinoin therapy ?- Continue emollients as directed ? ?Cheilitis secondary to isotretinoin therapy ?- Continue lip balm as directed, Dr. Luvenia Heller Cortibalm recommended ? ?Long term medication management (isotretinoin) ?- While taking Isotretinoin and for 30 days after you finish the medication, do not share pills, do not donate blood. Isotretinoin is best absorbed when taken with a fatty meal. Isotretinoin can make you sensitive to the sun. Daily careful sun protection including sunscreen SPF 30+ when outdoors is recommended. ? ?Follow-up in 30 days. ? ?I, Marye Round, CMA, am acting as scribe for Sarina Ser, MD .  ?Documentation: I have reviewed the above documentation for accuracy and completeness, and I agree with the above. ? ?Sarina Ser, MD ? ?

## 2021-05-29 ENCOUNTER — Encounter: Payer: Self-pay | Admitting: Dermatology

## 2021-06-01 ENCOUNTER — Telehealth: Payer: Self-pay

## 2021-06-01 NOTE — Telephone Encounter (Signed)
Returned call to patient and advised her that the swollen lymph node could be a result from the actual acne and not the medicine. Advised patient she could continue ibuprofen as needed and that you will discuss at follow up and to let us know if any worsening.  ?Carol Richards., RMA ?

## 2021-06-01 NOTE — Telephone Encounter (Signed)
Patient called concerned about a swollen lymph node at jaw line and wants to know if it could be related to her isotretinoin. The spot came up about a week ago and was tender and became swollen, improves with ibuprofen. Patient said that she has gotten similar swollen lymph nodes in the past.  ?

## 2021-06-29 ENCOUNTER — Ambulatory Visit (INDEPENDENT_AMBULATORY_CARE_PROVIDER_SITE_OTHER): Payer: PRIVATE HEALTH INSURANCE | Admitting: Dermatology

## 2021-06-29 VITALS — Wt 168.0 lb

## 2021-06-29 DIAGNOSIS — L853 Xerosis cutis: Secondary | ICD-10-CM

## 2021-06-29 DIAGNOSIS — Z79899 Other long term (current) drug therapy: Secondary | ICD-10-CM

## 2021-06-29 DIAGNOSIS — K130 Diseases of lips: Secondary | ICD-10-CM | POA: Diagnosis not present

## 2021-06-29 DIAGNOSIS — L700 Acne vulgaris: Secondary | ICD-10-CM | POA: Diagnosis not present

## 2021-06-29 MED ORDER — ISOTRETINOIN 40 MG PO CAPS: 40.0000 mg | ORAL_CAPSULE | Freq: Every day | ORAL | 0 refills | Status: DC

## 2021-06-29 NOTE — Patient Instructions (Addendum)
Acne is Severe; chronic and persistent; not at goal. ?Patient is on Isotretinoin -  requiring FDA mandated monthly evaluations and laboratory monitoring. ? ?While taking Isotretinoin and for 30 days after you finish the medication, do not get pregnant, do not share pills, do not donate blood.  Generic isotretinoin is best absorbed when taken with a fatty meal. Isotretinoin can make you sensitive to the sun. Daily careful sun protection including sunscreen SPF 30+ when outdoors is recommended. ? ? ? ?If You Need Anything After Your Visit ? ?If you have any questions or concerns for your doctor, please call our main line at [phone] and press option 4 to reach your doctor's medical assistant. If no one answers, please leave a voicemail as directed and we will return your call as soon as possible. Messages left after 4 pm will be answered the following business day.  ? ?You may also send [place] a message via MyChart. We typically respond to MyChart messages within 1-2 business days. ? ?For prescription refills, please ask your pharmacy to contact our office. Our fax number is [phone]. ? ?If you have an urgent issue when the clinic is closed that cannot wait until the next business day, you can page your doctor at the number below.   ? ?Please note that while we do our best to be available for urgent issues outside of office hours, we are not available 24/7.  ? ?If you have an urgent issue and are unable to reach [place], you may choose to seek medical care at your doctor's office, retail clinic, urgent care center, or emergency room. ? ?If you have a medical emergency, please immediately call 911 or go to the emergency department. ? ?Pager Numbers ? ?- Dr. Nehemiah Massed: [phone] ? ?- Dr. Laurence Ferrari: [phone] ? ?- Dr. Nicole Kindred: [phone] ? ?In the event of inclement weather, please call our main line at [phone] for an update on the status of any delays or closures. ? ?Dermatology Medication Tips: ?Please keep the  boxes that topical medications come in in order to help keep track of the instructions about where and how to use these. Pharmacies typically print the medication instructions only on the boxes and not directly on the medication tubes.  ? ?If your medication is too expensive, please contact our office at [phone] option 4 or send [place] a message through [place].  ? ?We are unable to tell what your co-pay for medications will be in advance as this is different depending on your insurance coverage. However, we may be able to find a substitute medication at lower cost or fill out paperwork to get insurance to cover a needed medication.  ? ?If a prior authorization is required to get your medication covered by your insurance company, please allow [place] 1-2 business days to complete this process. ? ?Drug prices often vary depending on where the prescription is filled and some pharmacies may offer cheaper prices. ? ?The website www.goodrx.com contains coupons for medications through different pharmacies. The prices here do not account for what the cost may be with help from insurance (it may be cheaper with your insurance), but the website can give you the price if you did not use any insurance.  ?- You can print the associated coupon and take it with your prescription to the pharmacy.  ?- You may also stop by our office during regular business hours and pick up a GoodRx coupon card.  ?- If you need your prescription sent electronically to a different  pharmacy, notify our office through [facility] or by phone at [phone] option 4. ? ? ? ? ?Si Usted Necesita Algo Despu?s de Su Visita ? ?Tambi?n puede enviarnos un mensaje a trav?s de MyChart. Por lo general respondemos a los mensajes de MyChart en el transcurso de 1 a 2 d?as h?biles. ? ?Para renovar recetas, por favor pida a su farmacia que se ponga en contacto con nuestra oficina. Nuestro n?mero de fax es el [phone]. ? ?Si tiene un asunto urgente cuando la  cl?nica est? cerrada y que no puede esperar hasta el siguiente d?a h?bil, puede llamar/localizar a su doctor(a) al n?mero que aparece a continuaci?n.  ? ?Por favor, tenga en cuenta que aunque hacemos todo lo posible para estar disponibles para asuntos urgentes fuera del horario de oficina, no estamos disponibles las 24 horas del d?a, los 7 d?as de la semana.  ? ?Si tiene un problema urgente y no puede comunicarse con nosotros, puede optar por buscar atenci?n m?dica  en el consultorio de su doctor(a), en una cl?nica privada, en un centro de atenci?n urgente o en una sala de emergencias. ? ?Si tiene Engineer, maintenance (IT) m?dica, por favor llame inmediatamente al 911 o vaya a la sala de emergencias. ? ?N?meros de b?per ? ?- Dr. Nehemiah Massed: [phone] ? ?- Dra. Moye: [phone] ? ?- Dra. Nicole Kindred: [phone] ? ?En caso de inclemencias del tiempo, por favor llame a nuestra l?nea principal al [phone] para una actualizaci?n sobre el estado de cualquier retraso o cierre. ? ?Consejos para la medicaci?n en dermatolog?a: ?Por favor, guarde las cajas en las que vienen los medicamentos de uso t?pico para ayudarle a seguir las instrucciones sobre d?nde y c?mo usarlos. Las farmacias generalmente imprimen las instrucciones del medicamento s?lo en las cajas y no directamente en los tubos del [place].  ? ?Si su medicamento es muy caro, por favor, p?ngase en contacto con Zigmund Daniel llamando al [phone] y presione la opci?n 4 o env?enos un mensaje a trav?s de MyChart.  ? ?No podemos decirle cu?l ser? su copago por los medicamentos por adelantado ya que esto es diferente dependiendo de la cobertura de su seguro. Sin embargo, es posible que podamos encontrar un medicamento sustituto a Electrical engineer un formulario para que el seguro cubra el medicamento que se considera necesario.  ? ?Si se requiere Ardelia Mems autorizaci?n previa para que su compa??a de seguros [place] su medicamento, por favor perm?tanos de 1 a 2 d?as h?biles  para completar este proceso. ? ?Los precios de los medicamentos var?an con frecuencia dependiendo del Environmental consultant de d?nde se surte la receta y alguna farmacias pueden ofrecer precios m?s baratos. ? ?El sitio web www.goodrx.com tiene cupones para medicamentos de Airline pilot. Los precios aqu? no tienen en cuenta lo que podr?a costar con la ayuda del seguro (puede ser m?s barato con su seguro), pero el sitio web puede darle el precio si no utiliz? ning?n seguro.  ?- Puede imprimir el cup?n correspondiente y llevarlo con su receta a la farmacia.  ?- Tambi?n puede pasar por nuestra oficina durante el horario de atenci?n regular y recoger una tarjeta de cupones de GoodRx.  ?- Si necesita que su receta se env?e electr?nicamente a Chiropodist, informe a nuestra oficina a trav?s de MyChart de [place] o por tel?fono llamando al [phone] y presione la opci?n 4. ? ?

## 2021-06-29 NOTE — Progress Notes (Signed)
? ?Isotretinoin Follow-Up Visit ?  ?Subjective  ?Carol Richards is a 51 y.o. female who presents for the following: Follow-up (Patient here today for week 8 on isotretinion follow up. She is currently on 30 mg of isotretinion daily. She reports some dry skin, dry eyes, dry lips and dry nose since taking medicaiton. She also reports some break outs since her last appointment. ). ? ?Week # 8 ? ? Isotretinoin F/U - 06/29/21 1700   ? ?  ? Isotretinoin Follow Up  ? iPledge # [id number]  ? Date 06/29/21   ? Weight 168 lb (76.2 kg)   ? Acne breakouts since last visit? Yes   ?  ? Dosage  ? Target Dosage (mg) 12450   ?  ? Side Effects  ? Skin Chapped Lips;Dry Nose;Dry Skin;Dry Lips;Dry Eyes   ? Gastrointestinal WNL   ? Neurological WNL   ? Constitutional WNL   ? ?  ?  ? ?  ? ?Side effects: Dry skin, dry lips ? ?Denies changes in night vision, shortness of breath, abdominal pain, nausea, vomiting, diarrhea, blood in stool or urine, visual changes, headaches, epistaxis, joint pain, myalgias, mood changes, depression, or suicidal ideation.  ? ?Patient is not pregnant, not seeking pregnancy, and not breastfeeding. ? ?The following portions of the chart were reviewed this encounter and updated as appropriate: medications, allergies, medical history ? ?Review of Systems:  No other skin or systemic complaints except as noted in HPI or Assessment and Plan. ? ?Objective  ?Well appearing patient in no apparent distress; mood and affect are within normal limits. ? ?An examination of the face, neck, chest, and back was performed and relevant findings are noted below.  ? ? ? ?Assessment & Plan  ? ?Acne vulgaris ?face ? ?Chronic and persistent condition with duration or expected duration over one year. Condition is symptomatic/ bothersome to patient. Not currently at goal. ? ?[name] & LH both consistent with Postmenopausal. ?Registered as "Non-child bearing potential female ? ?Ipledge # [id number] [facility] [address] ?Total 1500 mg   ?Total 45.72 mg per kg  ? ?Increase Isotretinoin 30 mg to 40 mg daily. ? ?Start Isotretinoin 40 mg capsule by mouth daily with food.  ? ?Patient confirmed in [place] and isotretinoin sent to pharmacy. ? ? ?Acne is Severe; chronic and persistent; not at goal. ?Patient is on Isotretinoin -  requiring FDA mandated monthly evaluations and laboratory monitoring. ? ?While taking Isotretinoin and for 30 days after you finish the medication, do not get pregnant, do not share pills, do not donate blood.  Generic isotretinoin is best absorbed when taken with a fatty meal. Isotretinoin can make you sensitive to the sun. Daily careful sun protection including sunscreen SPF 30+ when outdoors is recommended. ? ? ?ISOtretinoin (ACCUTANE) 40 MG capsule - face ?Take 1 capsule (40 mg total) by mouth daily. Take with food ? ? ?Xerosis secondary to isotretinoin therapy ?- Continue emollients as directed ? ?Cheilitis secondary to isotretinoin therapy ?- Continue lip balm as directed, Dr. Luvenia Heller Cortibalm recommended ? ?Long term medication management (isotretinoin) ?- While taking Isotretinoin and for 30 days after you finish the medication, do not get pregnant, do not share pills, do not donate blood. Isotretinoin is best absorbed when taken with a fatty meal. Isotretinoin can make you sensitive to the sun. Daily careful sun protection including sunscreen SPF 30+ when outdoors is recommended. ? ?Follow-up in 30 days. ?I, Ruthell Rummage, CMA, am acting as scribe for Sarina Ser, MD. ?Documentation:  I have reviewed the above documentation for accuracy and completeness, and I agree with the above. ? ?Sarina Ser, MD ? ?

## 2021-06-30 ENCOUNTER — Encounter: Payer: Self-pay | Admitting: Dermatology

## 2021-07-30 ENCOUNTER — Ambulatory Visit (INDEPENDENT_AMBULATORY_CARE_PROVIDER_SITE_OTHER): Payer: PRIVATE HEALTH INSURANCE | Admitting: Dermatology

## 2021-07-30 VITALS — Wt 168.0 lb

## 2021-07-30 DIAGNOSIS — L700 Acne vulgaris: Secondary | ICD-10-CM | POA: Diagnosis not present

## 2021-07-30 DIAGNOSIS — L309 Dermatitis, unspecified: Secondary | ICD-10-CM | POA: Diagnosis not present

## 2021-07-30 DIAGNOSIS — Z79899 Other long term (current) drug therapy: Secondary | ICD-10-CM

## 2021-07-30 DIAGNOSIS — K130 Diseases of lips: Secondary | ICD-10-CM | POA: Diagnosis not present

## 2021-07-30 MED ORDER — TACROLIMUS 0.1 % EX OINT: TOPICAL_OINTMENT | Freq: Two times a day (BID) | CUTANEOUS | 0 refills | Status: AC

## 2021-07-30 MED ORDER — ISOTRETINOIN 40 MG PO CAPS: 40.0000 mg | ORAL_CAPSULE | Freq: Every day | ORAL | 0 refills | Status: DC

## 2021-07-30 MED ORDER — ABSORICA LD 32 MG PO CAPS: 1.0000 "application " | ORAL_CAPSULE | Freq: Every day | ORAL | 0 refills | Status: DC

## 2021-07-30 MED ORDER — MOMETASONE FUROATE 0.1 % EX CREA: 1.0000 "application " | TOPICAL_CREAM | Freq: Every day | CUTANEOUS | 1 refills | Status: AC | PRN

## 2021-07-30 NOTE — Patient Instructions (Signed)

## 2021-07-30 NOTE — Progress Notes (Signed)
Isotretinoin Follow-Up Visit   Subjective  Carol Richards is a 51 y.o. female who presents for the following: Acne (30 day follow up - week 12 Isotretinoin 40 mg 1 po qd).  Week # 12   Isotretinoin F/U - 07/30/21 0800       Isotretinoin Follow Up   iPledge # [id number]    Date 07/30/21    Weight 168 lb (76.2 kg)    Acne breakouts since last visit? Yes      Dosage   Target Dosage (mg) 12450    Current (To Date) Dosage (mg) 2700    To Go Dosage (mg) 9750      Side Effects   Skin Chapped Lips;Dry Skin    Gastrointestinal WNL    Neurological WNL    Constitutional Other   Upper back pain            Side effects: Dry skin, dry lips  Denies changes in night vision, shortness of breath, abdominal pain, nausea, vomiting, diarrhea, blood in stool or urine, visual changes, headaches, epistaxis, joint pain, myalgias, mood changes, depression, or suicidal ideation.   The following portions of the chart were reviewed this encounter and updated as appropriate: medications, allergies, medical history  Review of Systems:  No other skin or systemic complaints except as noted in HPI or Assessment and Plan.  Objective  Well appearing patient in no apparent distress; mood and affect are within normal limits.  An examination of the face, neck, chest, and back was performed and relevant findings are noted below.   Pink patches of arms   Assessment & Plan   Acne vulgaris Face Chronic and persistent condition with duration or expected duration over one year. Condition is symptomatic/ bothersome to patient. Not currently at goal.   [facility] & [facility] both consistent with Postmenopausal. Registered as "Non-child bearing potential female   [place] # [phone] [address] Total 2700 mg  Total 35.4 mg/kg  Continue Absorica LD 32 mg 1 po qd    Patient confirmed in iPledge and isotretinoin sent to pharmacy.   Acne is Severe; chronic and persistent; not at goal. Patient is  on Isotretinoin -  requiring FDA mandated monthly evaluations and laboratory monitoring.   While taking Isotretinoin and for 30 days after you finish the medication, do not get pregnant, do not share pills, do not donate blood.  Generic isotretinoin is best absorbed when taken with a fatty meal. Isotretinoin can make you sensitive to the sun. Daily careful sun protection including sunscreen SPF 30+ when outdoors is recommended.   ISOtretinoin Micronized (ABSORICA LD) 32 MG CAPS - Face Take 1 application. by mouth daily.  Eczema, exacerbated by isotretinoin Start Mometasone cream qd prn rash Atopic dermatitis (eczema) is a chronic, relapsing, pruritic condition that can significantly affect quality of life. It is often associated with allergic rhinitis and/or asthma and can require treatment with topical medications, phototherapy, or in severe cases biologic injectable medication (Dupixent; Adbry) or Oral JAK inhibitors.  mometasone (ELOCON) 0.1 % cream Apply 1 application. topically daily as needed (Rash).  Cheilitis Lips Start Tacrolimus oint qd-bid  tacrolimus (PROTOPIC) 0.1 % ointment - Lips Apply topically 2 (two) times daily.  Long term medication management (isotretinoin) - While taking Isotretinoin and for 30 days after you finish the medication, do not share pills, do not donate blood. Isotretinoin is best absorbed when taken with a fatty meal. Isotretinoin can make you sensitive to the sun. Daily careful sun protection  including sunscreen SPF 30+ when outdoors is recommended.  Follow-up in 30 days.  I, Ashok Cordia, CMA, am acting as scribe for Sarina Ser, MD . Documentation: I have reviewed the above documentation for accuracy and completeness, and I agree with the above.  Sarina Ser, MD

## 2021-08-10 ENCOUNTER — Telehealth: Payer: Self-pay

## 2021-08-10 NOTE — Telephone Encounter (Signed)
Absorica LD not covered by patient's insurance. OK to switch patient back to Isotretinoin '40mg'$  QD? ?

## 2021-08-11 ENCOUNTER — Other Ambulatory Visit: Payer: Self-pay

## 2021-08-11 MED ORDER — ISOTRETINOIN 40 MG PO CAPS: 40.0000 mg | ORAL_CAPSULE | Freq: Every day | ORAL | 0 refills | Status: AC

## 2021-08-11 NOTE — Progress Notes (Signed)
Isotretinoin '40mg'$  QD sent in per patient request due to Absorica not covered.  ? ?Left voicemail for patient to return my call. We need to reschedule her appointment one month out from today for accutane follow up. ?

## 2021-08-17 ENCOUNTER — Encounter: Payer: Self-pay | Admitting: Dermatology

## 2021-08-31 ENCOUNTER — Ambulatory Visit: Payer: PRIVATE HEALTH INSURANCE | Admitting: Dermatology

## 2021-09-16 ENCOUNTER — Ambulatory Visit (INDEPENDENT_AMBULATORY_CARE_PROVIDER_SITE_OTHER): Payer: PRIVATE HEALTH INSURANCE | Admitting: Dermatology

## 2021-09-16 VITALS — Wt 168.0 lb

## 2021-09-16 DIAGNOSIS — L853 Xerosis cutis: Secondary | ICD-10-CM | POA: Diagnosis not present

## 2021-09-16 DIAGNOSIS — L738 Other specified follicular disorders: Secondary | ICD-10-CM | POA: Diagnosis not present

## 2021-09-16 DIAGNOSIS — L700 Acne vulgaris: Secondary | ICD-10-CM

## 2021-09-16 DIAGNOSIS — K130 Diseases of lips: Secondary | ICD-10-CM | POA: Diagnosis not present

## 2021-09-16 DIAGNOSIS — Z79899 Other long term (current) drug therapy: Secondary | ICD-10-CM

## 2021-09-16 MED ORDER — ISOTRETINOIN 30 MG PO CAPS: 30.0000 mg | ORAL_CAPSULE | Freq: Every day | ORAL | 0 refills | Status: DC

## 2021-09-16 NOTE — Progress Notes (Unsigned)
   Isotretinoin Follow-Up Visit   Subjective  Carol Richards is a 51 y.o. female who presents for the following: Acne (30 day follow up - week #16 isotretinoin 40 mg qd).  Week # 16   Isotretinoin F/U - 09/16/21 0800       Isotretinoin Follow Up   iPledge # [id number]    Date 09/16/21    Weight 168 lb (76.2 kg)    Acne breakouts since last visit? No      Dosage   Target Dosage (mg) 12450    Current (To Date) Dosage (mg) 3900    To Go Dosage (mg) 8550      Side Effects   Skin Chapped Lips;Dry Skin    Gastrointestinal WNL    Neurological WNL    Constitutional Other   Some back pain            Side effects: Dry skin, dry lips  Denies changes in night vision, shortness of breath, abdominal pain, nausea, vomiting, diarrhea, blood in stool or urine, visual changes, headaches, epistaxis, joint pain, myalgias, mood changes, depression, or suicidal ideation.   The following portions of the chart were reviewed this encounter and updated as appropriate: medications, allergies, medical history  Review of Systems:  No other skin or systemic complaints except as noted in HPI or Assessment and Plan.  Objective  Well appearing patient in no apparent distress; mood and affect are within normal limits.  An examination of the face, neck, chest, and back was performed and relevant findings are noted below.     Assessment & Plan   Acne vulgaris Face  Chronic and persistent condition with duration or expected duration over one year. Condition is symptomatic/ bothersome to patient. Not currently at goal.   [facility] & [facility] both consistent with Postmenopausal. Registered as "Non-child bearing potential female   [place] # [phone] [address] Total 3900 mg  Total 51.18 mg/kg   Decrease Isotretinoin 30 mg 1 po qd due to back pain. Advised patient that if back pain continues after a couple weeks, she may decrease to 30 mg 1 po qod   Patient confirmed in iPledge and  isotretinoin sent to pharmacy.   Acne is Severe; chronic and persistent; not at goal. Patient is on Isotretinoin -  requiring FDA mandated monthly evaluations and laboratory monitoring.   While taking Isotretinoin and for 30 days after you finish the medication, do not get pregnant, do not share pills, do not donate blood.  Generic isotretinoin is best absorbed when taken with a fatty meal. Isotretinoin can make you sensitive to the sun. Daily careful sun protection including sunscreen SPF 30+ when outdoors is recommended.  ISOtretinoin (ACCUTANE) 30 MG capsule - Face Take 1 capsule (30 mg total) by mouth daily.  Sebaceous hyperplasia Face  Isotretinoin 30 mg 1 po qd as planned    Xerosis secondary to isotretinoin therapy - Continue emollients as directed  Cheilitis secondary to isotretinoin therapy - Continue lip balm as directed, Dr. Luvenia Heller Cortibalm recommended  Long term medication management (isotretinoin) - While taking Isotretinoin and for 30 days after you finish the medication, do not share pills, do not donate blood. Isotretinoin is best absorbed when taken with a fatty meal. Isotretinoin can make you sensitive to the sun. Daily careful sun protection including sunscreen SPF 30+ when outdoors is recommended.  Follow-up in 30 days.   I, Ashok Cordia, CMA, am acting as scribe for Sarina Ser, MD .

## 2021-09-16 NOTE — Patient Instructions (Signed)
Acne is Severe; chronic and persistent; not at goal. Patient is on Isotretinoin -  requiring FDA mandated monthly evaluations and laboratory monitoring.  While taking isotretinoin, do not share pills and do not donate blood. Generic isotretinoin is best absorbed when taken with a fatty meal. Isotretinoin can make you sensitive to the sun. Daily careful sun protection including sunscreen SPF 30+ when outdoors is recommended.     Due to recent changes in healthcare laws, you may see results of your pathology and/or laboratory studies on MyChart before the doctors have had a chance to review them. We understand that in some cases there may be results that are confusing or concerning to you. Please understand that not all results are received at the same time and often the doctors may need to interpret multiple results in order to provide you with the best plan of care or course of treatment. Therefore, we ask that you please give us 2 business days to thoroughly review all your results before contacting the office for clarification. Should we see a critical lab result, you will be contacted sooner.   If You Need Anything After Your Visit  If you have any questions or concerns for your doctor, please call our main line at [phone] and press option 4 to reach your doctor's medical assistant. If no one answers, please leave a voicemail as directed and we will return your call as soon as possible. Messages left after 4 pm will be answered the following business day.   You may also send us a message via MyChart. We typically respond to MyChart messages within 1-2 business days.  For prescription refills, please ask your pharmacy to contact our office. Our fax number is [phone].  If you have an urgent issue when the clinic is closed that cannot wait until the next business day, you can page your doctor at the number below.    Please note that while we do our best to be available for urgent issues  outside of office hours, we are not available 24/7.   If you have an urgent issue and are unable to reach us, you may choose to seek medical care at your doctor's office, retail clinic, urgent care center, or emergency room.  If you have a medical emergency, please immediately call 911 or go to the emergency department.  Pager Numbers  - Dr. Kowalski: [phone]  - Dr. Moye: [phone]  - Dr. Stewart: [phone]  In the event of inclement weather, please call our main line at [phone] for an update on the status of any delays or closures.  Dermatology Medication Tips: Please keep the boxes that topical medications come in in order to help keep track of the instructions about where and how to use these. Pharmacies typically print the medication instructions only on the boxes and not directly on the medication tubes.   If your medication is too expensive, please contact our office at [phone] option 4 or send us a message through MyChart.   We are unable to tell what your co-pay for medications will be in advance as this is different depending on your insurance coverage. However, we may be able to find a substitute medication at lower cost or fill out paperwork to get insurance to cover a needed medication.   If a prior authorization is required to get your medication covered by your insurance company, please allow us 1-2 business days to complete this process.  Drug prices often vary depending on where the   prescription is filled and some pharmacies may offer cheaper prices.  The website www.goodrx.com contains coupons for medications through different pharmacies. The prices here do not account for what the cost may be with help from insurance (it may be cheaper with your insurance), but the website can give you the price if you did not use any insurance.  - You can print the associated coupon and take it with your prescription to the pharmacy.  - You may also stop by our  office during regular business hours and pick up a GoodRx coupon card.  - If you need your prescription sent electronically to a different pharmacy, notify our office through [place] MyChart or by phone at [phone] option 4.     Si Usted Necesita Algo Despus de Su Visita  Tambin puede enviarnos un mensaje a travs de MyChart. Por lo general respondemos a los mensajes de MyChart en el transcurso de 1 a 2 das hbiles.  Para renovar recetas, por favor pida a su farmacia que se ponga en contacto con nuestra oficina. Nuestro nmero de fax es el [phone].  Si tiene un asunto urgente cuando la clnica est cerrada y que no puede esperar hasta el siguiente da hbil, puede llamar/localizar a su doctor(a) al nmero que aparece a continuacin.   Por favor, tenga en cuenta que aunque hacemos todo lo posible para estar disponibles para asuntos urgentes fuera del horario de oficina, no estamos disponibles las 24 horas del da, los 7 das de la semana.   Si tiene un problema urgente y no puede comunicarse con nosotros, puede optar por buscar atencin mdica  en el consultorio de su doctor(a), en una clnica privada, en un centro de atencin urgente o en una sala de emergencias.  Si tiene una emergencia mdica, por favor llame inmediatamente al 911 o vaya a la sala de emergencias.  Nmeros de bper  - Dr. Kowalski: [phone]  - Dra. Moye: [phone]  - Dra. Stewart: [phone]  En caso de inclemencias del tiempo, por favor llame a nuestra lnea principal al [phone] para una actualizacin sobre el estado de cualquier retraso o cierre.  Consejos para la medicacin en dermatologa: Por favor, guarde las cajas en las que vienen los medicamentos de uso tpico para ayudarle a seguir las instrucciones sobre dnde y cmo usarlos. Las farmacias generalmente imprimen las instrucciones del medicamento slo en las cajas y no directamente en los tubos del medicamento.   Si su  medicamento es muy caro, por favor, pngase en contacto con nuestra oficina llamando al [phone] y presione la opcin 4 o envenos un mensaje a travs de MyChart.   No podemos decirle cul ser su copago por los medicamentos por adelantado ya que esto es diferente dependiendo de la cobertura de su seguro. Sin embargo, es posible que podamos encontrar un medicamento sustituto a menor costo o llenar un formulario para que el seguro cubra el medicamento que se considera necesario.   Si se requiere una autorizacin previa para que su compaa de seguros cubra su medicamento, por favor permtanos de 1 a 2 das hbiles para completar este proceso.  Los precios de los medicamentos varan con frecuencia dependiendo del lugar de dnde se surte la receta y alguna farmacias pueden ofrecer precios ms baratos.  El sitio web www.goodrx.com tiene cupones para medicamentos de diferentes farmacias. Los precios aqu no tienen en cuenta lo que podra costar con la ayuda del seguro (puede ser ms barato con su seguro), pero el sitio web   puede darle el precio si no utiliz ningn seguro.  - Puede imprimir el cupn correspondiente y llevarlo con su receta a la farmacia.  - Tambin puede pasar por nuestra oficina durante el horario de atencin regular y recoger una tarjeta de cupones de GoodRx.  - Si necesita que su receta se enve electrnicamente a una farmacia diferente, informe a nuestra oficina a travs de MyChart de [place] o por telfono llamando al [phone] y presione la opcin 4.  

## 2021-09-18 ENCOUNTER — Encounter: Payer: Self-pay | Admitting: Dermatology

## 2021-10-15 ENCOUNTER — Ambulatory Visit: Payer: PRIVATE HEALTH INSURANCE | Admitting: Dermatology

## 2021-10-22 ENCOUNTER — Ambulatory Visit: Payer: PRIVATE HEALTH INSURANCE | Admitting: Dermatology

## 2021-11-03 ENCOUNTER — Other Ambulatory Visit: Payer: Self-pay | Admitting: Obstetrics and Gynecology

## 2021-11-03 DIAGNOSIS — Z1231 Encounter for screening mammogram for malignant neoplasm of breast: Secondary | ICD-10-CM

## 2021-11-04 ENCOUNTER — Ambulatory Visit (INDEPENDENT_AMBULATORY_CARE_PROVIDER_SITE_OTHER): Payer: PRIVATE HEALTH INSURANCE | Admitting: Dermatology

## 2021-11-04 ENCOUNTER — Encounter: Payer: Self-pay | Admitting: Dermatology

## 2021-11-04 VITALS — Wt 168.0 lb

## 2021-11-04 DIAGNOSIS — L853 Xerosis cutis: Secondary | ICD-10-CM | POA: Diagnosis not present

## 2021-11-04 DIAGNOSIS — L700 Acne vulgaris: Secondary | ICD-10-CM

## 2021-11-04 DIAGNOSIS — Z79899 Other long term (current) drug therapy: Secondary | ICD-10-CM | POA: Diagnosis not present

## 2021-11-04 DIAGNOSIS — K130 Diseases of lips: Secondary | ICD-10-CM

## 2021-11-04 DIAGNOSIS — L738 Other specified follicular disorders: Secondary | ICD-10-CM

## 2021-11-04 MED ORDER — ISOTRETINOIN 30 MG PO CAPS: 30.0000 mg | ORAL_CAPSULE | Freq: Every day | ORAL | 0 refills | Status: AC

## 2021-11-04 NOTE — Progress Notes (Signed)
Isotretinoin Follow-Up Visit   Subjective  Carol Richards is a 51 y.o. female who presents for the following: Acne (With sebaceous hyperplasia - week 20, patient currently on Isotretinoin '30mg'$  po QD. Pt states that back pain has improved since decreasing to '30mg'$  po QD. She has no c/o mood changes, depression, homicidal or suicidal thoughts).  Week # 20   Isotretinoin F/U - 11/04/21 0800       Isotretinoin Follow Up   iPledge # [id number]    Date 11/04/21    Weight 168 lb (76.2 kg)    Acne breakouts since last visit? Yes      Dosage   Target Dosage (mg) 12450    Current (To Date) Dosage (mg) 4800    To Go Dosage (mg) 7650      Side Effects   Skin Chapped Lips;Dry Eyes;Dry Lips;Dry Nose;Dry Skin;Nosebleed    Gastrointestinal WNL    Neurological WNL    Constitutional WNL            Side effects: Dry skin, dry lips  Denies changes in night vision, shortness of breath, abdominal pain, nausea, vomiting, diarrhea, blood in stool or urine, visual changes, headaches, epistaxis, joint pain, myalgias, mood changes, depression, or suicidal ideation.   The following portions of the chart were reviewed this encounter and updated as appropriate: medications, allergies, medical history  Review of Systems:  No other skin or systemic complaints except as noted in HPI or Assessment and Plan.  Objective  Well appearing patient in no apparent distress; mood and affect are within normal limits.  An examination of the face, neck, chest, and back was performed and relevant findings are noted below.   Face One active papule of the face.   Assessment & Plan   Acne vulgaris Face With sebaceous hyperplasia -  Chronic and persistent condition with duration or expected duration over one year. Condition is symptomatic/ bothersome to patient. Not currently at goal.   [facility] & [facility] both consistent with Postmenopausal. Registered as "Non-child bearing potential female   [place] #  [id number] [address] Total 4800 mg  Total 62.9 mg/kg   Continue Isotretinoin 30 mg 1 po qd due to back pain. Recommend continuing for 2-4 more months.   Patient confirmed in iPledge and isotretinoin sent to pharmacy.   Acne is Severe; chronic and persistent; not at goal. Patient is on Isotretinoin -  requiring FDA mandated monthly evaluations and laboratory monitoring.   While taking Isotretinoin and for 30 days after you finish the medication, do not get pregnant, do not share pills, do not donate blood.  Generic isotretinoin is best absorbed when taken with a fatty meal. Isotretinoin can make you sensitive to the sun. Daily careful sun protection including sunscreen SPF 30+ when outdoors is recommended.  Related Medications ISOtretinoin (ACCUTANE) 30 MG capsule Take 1 capsule (30 mg total) by mouth daily.  Xerosis secondary to isotretinoin therapy - Continue emollients as directed  Cheilitis secondary to isotretinoin therapy - Continue lip balm as directed, Dr. Luvenia Heller Cortibalm recommended  Long term medication management (isotretinoin) - While taking Isotretinoin and for 30 days after you finish the medication, do not share pills, do not donate blood. Isotretinoin is best absorbed when taken with a fatty meal. Isotretinoin can make you sensitive to the sun. Daily careful sun protection including sunscreen SPF 30+ when outdoors is recommended.  Follow-up in 30 days.  Luther Redo, CMA, am acting as scribe for Sarina Ser, MD .  Documentation: I have reviewed the above documentation for accuracy and completeness, and I agree with the above.  Sarina Ser, MD

## 2021-11-25 ENCOUNTER — Encounter: Payer: Self-pay | Admitting: Radiology

## 2021-11-25 ENCOUNTER — Ambulatory Visit
Admission: RE | Admit: 2021-11-25 | Discharge: 2021-11-25 | Disposition: A | Discharge status: Home / Self Care | Payer: PRIVATE HEALTH INSURANCE | Source: Ambulatory Visit | Attending: Obstetrics and Gynecology | Admitting: Obstetrics and Gynecology

## 2021-11-25 DIAGNOSIS — Z1231 Encounter for screening mammogram for malignant neoplasm of breast: Secondary | ICD-10-CM | POA: Diagnosis present

## 2021-12-10 ENCOUNTER — Ambulatory Visit: Payer: PRIVATE HEALTH INSURANCE | Admitting: Dermatology

## 2023-01-25 ENCOUNTER — Ambulatory Visit
Admission: RE | Admit: 2023-01-25 | Discharge: 2023-01-25 | Disposition: A | Discharge status: Home / Self Care | Payer: PRIVATE HEALTH INSURANCE | Source: Ambulatory Visit | Attending: Family Medicine | Admitting: Family Medicine

## 2023-01-25 ENCOUNTER — Other Ambulatory Visit: Payer: Self-pay | Admitting: Family Medicine

## 2023-01-25 DIAGNOSIS — R1031 Right lower quadrant pain: Secondary | ICD-10-CM

## 2023-01-25 DIAGNOSIS — R109 Unspecified abdominal pain: Secondary | ICD-10-CM

## 2023-01-25 MED ORDER — IOHEXOL 300 MG/ML  SOLN
100.0000 mL | Freq: Once | INTRAMUSCULAR | Status: AC | PRN
  Administered 2023-01-25: 100 mL via INTRAVENOUS

## 2023-02-23 ENCOUNTER — Other Ambulatory Visit: Payer: Self-pay | Admitting: Endocrinology

## 2023-02-23 DIAGNOSIS — E042 Nontoxic multinodular goiter: Secondary | ICD-10-CM

## 2023-02-23 DIAGNOSIS — E0590 Thyrotoxicosis, unspecified without thyrotoxic crisis or storm: Secondary | ICD-10-CM

## 2023-03-15 ENCOUNTER — Encounter
Admission: RE | Admit: 2023-03-15 | Discharge: 2023-03-15 | Disposition: A | Discharge status: Home / Self Care | Payer: PRIVATE HEALTH INSURANCE | Source: Ambulatory Visit | Attending: Endocrinology | Admitting: Endocrinology

## 2023-03-15 DIAGNOSIS — E042 Nontoxic multinodular goiter: Secondary | ICD-10-CM | POA: Diagnosis present

## 2023-03-15 DIAGNOSIS — E0590 Thyrotoxicosis, unspecified without thyrotoxic crisis or storm: Secondary | ICD-10-CM | POA: Diagnosis present

## 2023-03-15 MED ORDER — SODIUM IODIDE I-123 7.4 MBQ CAPS
431.9000 | ORAL_CAPSULE | Freq: Once | ORAL | Status: AC
  Administered 2023-03-15: 431.9 via ORAL

## 2023-03-16 ENCOUNTER — Encounter
Admission: RE | Admit: 2023-03-16 | Discharge: 2023-03-16 | Disposition: A | Discharge status: Home / Self Care | Payer: PRIVATE HEALTH INSURANCE | Source: Ambulatory Visit | Attending: Endocrinology | Admitting: Endocrinology

## 2023-03-18 ENCOUNTER — Other Ambulatory Visit: Payer: Self-pay | Admitting: Internal Medicine

## 2023-03-18 DIAGNOSIS — Z1231 Encounter for screening mammogram for malignant neoplasm of breast: Secondary | ICD-10-CM

## 2023-04-06 ENCOUNTER — Ambulatory Visit
Admission: RE | Admit: 2023-04-06 | Discharge: 2023-04-06 | Disposition: A | Discharge status: Home / Self Care | Payer: PRIVATE HEALTH INSURANCE | Source: Ambulatory Visit | Attending: Internal Medicine | Admitting: Internal Medicine

## 2023-04-06 DIAGNOSIS — Z1231 Encounter for screening mammogram for malignant neoplasm of breast: Secondary | ICD-10-CM | POA: Insufficient documentation
